# Patient Record
Sex: Male | Born: 1955 | Race: White | Hispanic: No | Marital: Married | Smoking: Never smoker
Health system: Southern US, Community
[De-identification: ages and names within clinical notes are randomized; demographics above are authoritative.]

## PROBLEM LIST (undated history)

## (undated) DIAGNOSIS — E785 Hyperlipidemia, unspecified: Secondary | ICD-10-CM

## (undated) DIAGNOSIS — L570 Actinic keratosis: Secondary | ICD-10-CM

## (undated) DIAGNOSIS — N200 Calculus of kidney: Secondary | ICD-10-CM

## (undated) DIAGNOSIS — I4892 Unspecified atrial flutter: Secondary | ICD-10-CM

## (undated) DIAGNOSIS — I10 Essential (primary) hypertension: Secondary | ICD-10-CM

## (undated) HISTORY — DX: Actinic keratosis: L57.0

## (undated) HISTORY — DX: Calculus of kidney: N20.0

## (undated) HISTORY — PX: WISDOM TOOTH EXTRACTION: SHX21

---

## 2015-05-21 ENCOUNTER — Encounter: Payer: Self-pay | Admitting: *Deleted

## 2015-05-21 ENCOUNTER — Emergency Department: Payer: BC Managed Care – PPO

## 2015-05-21 ENCOUNTER — Emergency Department
Admission: EM | Admit: 2015-05-21 | Discharge: 2015-05-21 | Disposition: A | Discharge status: Home / Self Care | Payer: BC Managed Care – PPO | Attending: Student | Admitting: Student

## 2015-05-21 DIAGNOSIS — N201 Calculus of ureter: Secondary | ICD-10-CM | POA: Diagnosis not present

## 2015-05-21 DIAGNOSIS — I10 Essential (primary) hypertension: Secondary | ICD-10-CM | POA: Insufficient documentation

## 2015-05-21 DIAGNOSIS — R1031 Right lower quadrant pain: Secondary | ICD-10-CM | POA: Diagnosis not present

## 2015-05-21 DIAGNOSIS — R109 Unspecified abdominal pain: Secondary | ICD-10-CM

## 2015-05-21 DIAGNOSIS — R52 Pain, unspecified: Secondary | ICD-10-CM

## 2015-05-21 HISTORY — DX: Essential (primary) hypertension: I10

## 2015-05-21 LAB — URINALYSIS COMPLETE WITH MICROSCOPIC (ARMC ONLY)
BACTERIA UA: NONE SEEN
BILIRUBIN URINE: NEGATIVE
Glucose, UA: NEGATIVE mg/dL
Ketones, ur: NEGATIVE mg/dL
Leukocytes, UA: NEGATIVE
Nitrite: NEGATIVE
PH: 5 (ref 5.0–8.0)
PROTEIN: NEGATIVE mg/dL
Specific Gravity, Urine: 1.017 (ref 1.005–1.030)

## 2015-05-21 LAB — COMPREHENSIVE METABOLIC PANEL
ALT: 24 U/L (ref 17–63)
AST: 30 U/L (ref 15–41)
Albumin: 4.5 g/dL (ref 3.5–5.0)
Alkaline Phosphatase: 85 U/L (ref 38–126)
Anion gap: 10 (ref 5–15)
BUN: 15 mg/dL (ref 6–20)
CHLORIDE: 107 mmol/L (ref 101–111)
CO2: 25 mmol/L (ref 22–32)
CREATININE: 1.13 mg/dL (ref 0.61–1.24)
Calcium: 9.5 mg/dL (ref 8.9–10.3)
GFR calc non Af Amer: >60 mL/min (ref >60)
Glucose, Bld: 112 mg/dL — ABNORMAL HIGH (ref 65–99)
POTASSIUM: 3.7 mmol/L (ref 3.5–5.1)
SODIUM: 142 mmol/L (ref 135–145)
Total Bilirubin: 0.8 mg/dL (ref 0.3–1.2)
Total Protein: 7.7 g/dL (ref 6.5–8.1)

## 2015-05-21 LAB — CBC
HEMATOCRIT: 43.6 % (ref 40.0–52.0)
Hemoglobin: 15.3 g/dL (ref 13.0–18.0)
MCH: 29.4 pg (ref 26.0–34.0)
MCHC: 35.1 g/dL (ref 32.0–36.0)
MCV: 83.8 fL (ref 80.0–100.0)
PLATELETS: 194 10*3/uL (ref 150–440)
RBC: 5.21 MIL/uL (ref 4.40–5.90)
RDW: 13.3 % (ref 11.5–14.5)
WBC: 8.3 10*3/uL (ref 3.8–10.6)

## 2015-05-21 LAB — LIPASE, BLOOD: LIPASE: 17 U/L (ref 11–51)

## 2015-05-21 MED ORDER — ONDANSETRON 4 MG PO TBDP: 4.0000 mg | ORAL_TABLET | Freq: Three times a day (TID) | ORAL | Status: DC | PRN

## 2015-05-21 MED ORDER — OXYCODONE HCL 5 MG PO TABS: 5.0000 mg | ORAL_TABLET | Freq: Four times a day (QID) | ORAL | Status: DC | PRN

## 2015-05-21 MED ORDER — SODIUM CHLORIDE 0.9 % IV BOLUS (SEPSIS)
1000.0000 mL | Freq: Once | INTRAVENOUS | Status: AC
  Administered 2015-05-21: 1000 mL via INTRAVENOUS

## 2015-05-21 MED ORDER — TAMSULOSIN HCL 0.4 MG PO CAPS: 0.4000 mg | ORAL_CAPSULE | Freq: Every day | ORAL | Status: DC

## 2015-05-21 MED ORDER — ONDANSETRON HCL 4 MG/2ML IJ SOLN
4.0000 mg | Freq: Once | INTRAMUSCULAR | Status: AC
  Administered 2015-05-21: 4 mg via INTRAVENOUS

## 2015-05-21 MED ORDER — ONDANSETRON HCL 4 MG/2ML IJ SOLN
INTRAMUSCULAR | Status: AC
  Filled 2015-05-21: qty 2

## 2015-05-21 MED ORDER — KETOROLAC TROMETHAMINE 30 MG/ML IJ SOLN
15.0000 mg | Freq: Once | INTRAMUSCULAR | Status: AC
  Administered 2015-05-21: 15 mg via INTRAVENOUS

## 2015-05-21 MED ORDER — ONDANSETRON HCL 4 MG/2ML IJ SOLN
4.0000 mg | Freq: Once | INTRAMUSCULAR | Status: AC
  Administered 2015-05-21: 4 mg via INTRAVENOUS
  Filled 2015-05-21: qty 2

## 2015-05-21 MED ORDER — MORPHINE SULFATE (PF) 4 MG/ML IV SOLN
4.0000 mg | Freq: Once | INTRAVENOUS | Status: AC
  Administered 2015-05-21: 4 mg via INTRAVENOUS
  Filled 2015-05-21: qty 1

## 2015-05-21 MED ORDER — KETOROLAC TROMETHAMINE 30 MG/ML IJ SOLN
INTRAMUSCULAR | Status: AC
  Administered 2015-05-21: 15 mg via INTRAVENOUS
  Filled 2015-05-21: qty 1

## 2015-05-21 NOTE — ED Notes (Signed)
Urine strainer given. Pt verbalizes understanding of use

## 2015-05-21 NOTE — ED Notes (Signed)
Dr Edd Fabian at bedside. Pt to be dc home

## 2015-05-21 NOTE — ED Notes (Signed)
Pt arrived to ED reporting sudden onset of LRQ abd pain and lower right sided back pain this morning. Pt reports feeling nauseous at this time but denies other symptoms. Pt denies trauma or injury. Pt alert and oriented at this time.

## 2015-05-21 NOTE — ED Provider Notes (Signed)
[facility] Emergency Department Provider Note  ____________________________________________  Time seen: Approximately 2:25 PM  I have reviewed the triage vital signs and the nursing notes.   HISTORY  Chief Complaint Abdominal Pain    HPI Juan Dunlap is a 60 y.o. male with history of hypertension presents for evaluation of flank and right lower abdominal pain since this morning, gradual onset, constant since onset, severe, no modifying factors. Pain associated with nausea, no vomiting, diarrhea, fevers or chills. No chest pain difficulty breathing. No recent illness including no cough, sneezing, runny nose. No dysuria or hematuria. No history of kidney stones but does have strong family history.   Past Medical History  Diagnosis Date  . Hypertension     borderline    There are no active problems to display for this patient.   History reviewed. No pertinent past surgical history.  Current Outpatient Rx  Name  Route  Sig  Dispense  Refill  . ondansetron (ZOFRAN ODT) 4 MG disintegrating tablet   Oral   Take 1 tablet (4 mg total) by mouth every 8 (eight) hours as needed for nausea or vomiting.   12 tablet   0   . oxyCODONE (ROXICODONE) 5 MG immediate release tablet   Oral   Take 1 tablet (5 mg total) by mouth every 6 (six) hours as needed for moderate pain. Do not drive while taking this medication.   12 tablet   0   . tamsulosin (FLOMAX) 0.4 MG CAPS capsule   Oral   Take 1 capsule (0.4 mg total) by mouth daily.   14 capsule   0     Allergies Review of patient's allergies indicates no known allergies.  History reviewed. No pertinent family history.  Social History Social History  Substance Use Topics  . Smoking status: Never Smoker   . Smokeless tobacco: None  . Alcohol Use: No    Review of Systems Constitutional: No fever/chills Eyes: No visual changes. ENT: No sore throat. Cardiovascular: Denies chest pain. Respiratory:  Denies shortness of breath. Gastrointestinal: + abdominal pain.  No nausea, no vomiting.  No diarrhea.  No constipation. Genitourinary: Negative for dysuria. Musculoskeletal: Positive for flank pain. Skin: Negative for rash. Neurological: Negative for headaches, focal weakness or numbness.  10-point ROS otherwise negative.  ____________________________________________   PHYSICAL EXAM:  VITAL SIGNS: ED Triage Vitals  Enc Vitals Group     BP 05/21/15 1413 174/88 mmHg     Pulse Rate 05/21/15 1413 57     Resp 05/21/15 1413 16     Temp 05/21/15 1413 98 F (36.7 C)     Temp Source 05/21/15 1413 Oral     SpO2 05/21/15 1413 97 %     Weight 05/21/15 1413 240 lb (108.863 kg)     Height 05/21/15 1413 6\' 2"  (1.88 m)     Head Cir --      Peak Flow --      Pain Score 05/21/15 1406 6     Pain Loc --      Pain Edu? --      Excl. in [place]? --     Constitutional: Alert and oriented. In distress secondary to pain. Eyes: Conjunctivae are normal. PERRL. EOMI. Head: Atraumatic. Nose: No congestion/rhinnorhea. Mouth/Throat: Mucous membranes are moist.  Oropharynx non-erythematous. Neck: No stridor.  Supple without meningismus. Cardiovascular: Normal rate, regular rhythm. Grossly normal heart sounds.  Good peripheral circulation. Respiratory: Normal respiratory effort.  No retractions. Lungs CTAB. Gastrointestinal: Soft and nontender.  No distention.  No CVA tenderness. Genitourinary: deferred Musculoskeletal: No lower extremity tenderness nor edema.  No joint effusions. Neurologic:  Normal speech and language. No gross focal neurologic deficits are appreciated. No gait instability. Skin:  Skin is warm, dry and intact. No rash noted. Psychiatric: Mood and affect are normal. Speech and behavior are normal.  ____________________________________________   LABS (all labs ordered are listed, but only abnormal results are displayed)  Labs Reviewed  COMPREHENSIVE METABOLIC PANEL - Abnormal;  Notable for the following:    Glucose, Bld 112 (*)    All other components within normal limits  URINALYSIS COMPLETEWITH MICROSCOPIC ([facility] ONLY) - Abnormal; Notable for the following:    Color, Urine YELLOW (*)    APPearance CLEAR (*)    Hgb urine dipstick 1+ (*)    Squamous Epithelial / LPF 0-5 (*)    All other components within normal limits  LIPASE, BLOOD  CBC   ____________________________________________  EKG  none ____________________________________________  RADIOLOGY  CT abdomen and pelvis IMPRESSION: 1. 4 mm stone at the right UVJ causing moderate right-sided hydronephrosis. Additional 1 mm stone within the distal right ureter, located approximately 2 cm proximal to the right UVJ. 2. Bilateral nephrolithiasis. ____________________________________________   PROCEDURES  Procedure(s) performed: None  Critical Care performed: No  ____________________________________________   INITIAL IMPRESSION / ASSESSMENT AND PLAN / ED COURSE  Pertinent labs & imaging results that were available during my care of the patient were reviewed by me and considered in my medical decision making (see chart for details).  Juan Dunlap is a 60 y.o. male with history of hypertension presents for evaluation of flank and right lower abdominal pain since this morning. On exam, he is in distress due to pain. His vital signs are stable, he is afebrile. He does not appear to have any right lower quadrant tenderness. We'll obtain screening labs, urinalysis, treat his pain and obtain CT of the abdomen and pelvis to evaluate for nephrolithiasis and rule out appendicitis.  ----------------------------------------- 5:39 PM on 05/21/2015 ----------------------------------------- Patient was given improvement of his pain at this time. He is sitting up in bed, smiling with daughter at bedside. CT scan shows 4 mm right UVJ stone which is the likely cause of his pain. Urinalysis not consistent with  infection. Unremarkable CBC, CMP lipase. We discussed expectant management, return precautions, need for close urology follow-up and he is comfortable with the discharge plan. DC home. ____________________________________________   FINAL CLINICAL IMPRESSION(S) / ED DIAGNOSES  Final diagnoses:  Flank pain, acute  Right lower quadrant abdominal pain  Pain  Ureterolithiasis      Joanne Gavel, MD 05/21/15 [phone]

## 2015-06-02 ENCOUNTER — Ambulatory Visit (INDEPENDENT_AMBULATORY_CARE_PROVIDER_SITE_OTHER): Payer: BC Managed Care – PPO | Admitting: Urology

## 2015-06-02 ENCOUNTER — Telehealth: Payer: Self-pay | Admitting: Urology

## 2015-06-02 ENCOUNTER — Encounter: Payer: Self-pay | Admitting: Urology

## 2015-06-02 VITALS — BP 150/72 | HR 55 | Ht 74.0 in | Wt 251.1 lb

## 2015-06-02 DIAGNOSIS — N201 Calculus of ureter: Secondary | ICD-10-CM

## 2015-06-02 DIAGNOSIS — Z8042 Family history of malignant neoplasm of prostate: Secondary | ICD-10-CM | POA: Diagnosis not present

## 2015-06-02 DIAGNOSIS — R3129 Other microscopic hematuria: Secondary | ICD-10-CM

## 2015-06-02 DIAGNOSIS — N200 Calculus of kidney: Secondary | ICD-10-CM

## 2015-06-02 DIAGNOSIS — N132 Hydronephrosis with renal and ureteral calculous obstruction: Secondary | ICD-10-CM | POA: Diagnosis not present

## 2015-06-02 LAB — URINALYSIS, COMPLETE
Bilirubin, UA: NEGATIVE
GLUCOSE, UA: NEGATIVE
KETONES UA: NEGATIVE
LEUKOCYTES UA: NEGATIVE
Nitrite, UA: NEGATIVE
PROTEIN UA: NEGATIVE
Urobilinogen, Ur: 0.2 mg/dL (ref 0.2–1.0)
pH, UA: 5.5 (ref 5.0–7.5)

## 2015-06-02 LAB — MICROSCOPIC EXAMINATION
Bacteria, UA: NONE SEEN
RBC, UA: NONE SEEN /hpf (ref 0–?)
WBC, UA: NONE SEEN /hpf (ref 0–?)

## 2015-06-02 MED ORDER — TAMSULOSIN HCL 0.4 MG PO CAPS: 0.4000 mg | ORAL_CAPSULE | Freq: Every day | ORAL | Status: DC

## 2015-06-02 NOTE — Telephone Encounter (Signed)
Would you send a copy of my note to Dr. Barbarann Ehlers office?

## 2015-06-02 NOTE — Progress Notes (Signed)
06/02/2015 11:12 PM   Juan Dunlap Mar 04, 1955 [id number]  Referring provider: Maryland Pink, MD [address] [facility] [place], [place] [address]  Chief Complaint  Patient presents with  . Nephrolithiasis    referred by ER    HPI: Patient is a 60 year old Caucasian male who is referred to [place] by [facility] emergency room for nephrolithiasis.    Patient states that on 05/21/2015 he's had the sudden onset of right-sided waist pain that radiated to the right flank.  The pain became so intense, he started to become nauseous and vomit. He sought treatment in the emergency room.  In the emergency room his, UA demonstrated 6-30 rbc's/hpf.  Cr of 1.13.  CT renal stone study noted a 4 mm stone at the right UVJ causing moderate right-sided hydronephrosis. Additional 1 mm stone within the distal right ureter, located approximately 2 cm proximal to the right UVJ. Bilateral nephrolithiasis.  (I personally reviewed the films the patient)   He was discharged with analgesics, tamsulosin and a strainer.    He experienced intense right-sided flank pain for 3 days after his emergency room visit and then the pain suddenly abated.  Today, he is experiencing a slight dysuria. He has not had gross hematuria, fever, chills, nausea or vomiting.  His UA is unremarkable.  He does not have a prior history of nephrolithiasis. His father and brother have had kidney stones in the past.  His father has been diagnosed with prostate cancer.  PMH: Past Medical History  Diagnosis Date  . Hypertension     borderline  . Kidney stones     Surgical History: Past Surgical History  Procedure Laterality Date  . None      Home Medications:    Medication List       This list is accurate as of: 06/02/15 11:12 PM.  Always use your most recent med list.               ondansetron 4 MG disintegrating tablet  Commonly known as:  ZOFRAN ODT  Take 1 tablet (4 mg total) by mouth  every 8 (eight) hours as needed for nausea or vomiting.     oxyCODONE 5 MG immediate release tablet  Commonly known as:  ROXICODONE  Take 1 tablet (5 mg total) by mouth every 6 (six) hours as needed for moderate pain. Do not drive while taking this medication.     tamsulosin 0.4 MG Caps capsule  Commonly known as:  FLOMAX  Take 1 capsule (0.4 mg total) by mouth daily.        Allergies: No Known Allergies  Family History: Family History  Problem Relation Age of Onset  . Kidney disease Neg Hx   . Prostate cancer Father   . Hematuria Brother   . Hematuria Father   . Kidney Stones Brother   . Kidney Stones Father     Social History:  reports that he has never smoked. He does not have any smokeless tobacco history on file. He reports that he does not drink alcohol or use illicit drugs.  ROS: UROLOGY Frequent Urination?: No Hard to postpone urination?: No Burning/pain with urination?: Yes Get up at night to urinate?: No Leakage of urine?: No Urine stream starts and stops?: No Trouble starting stream?: No Do you have to strain to urinate?: No Blood in urine?: No Urinary tract infection?: No Sexually transmitted disease?: No Injury to kidneys or bladder?: No Painful intercourse?: No Weak stream?: No  Erection problems?: No Penile pain?: No  Gastrointestinal Nausea?: Yes Vomiting?: Yes Indigestion/heartburn?: No Diarrhea?: No Constipation?: No  Constitutional Fever: No Night sweats?: No Weight loss?: No Fatigue?: No  Skin Skin rash/lesions?: No Itching?: No  Eyes Blurred vision?: No Double vision?: No  Ears/Nose/Throat Sore throat?: No Sinus problems?: No  Hematologic/Lymphatic Swollen glands?: No Easy bruising?: No  Cardiovascular Leg swelling?: No Chest pain?: No  Respiratory Cough?: No Shortness of breath?: No  Endocrine Excessive thirst?: No  Musculoskeletal Back pain?: Yes Joint pain?: Yes  Neurological Headaches?: No Dizziness?:  No  Psychologic Depression?: No Anxiety?: No  Physical Exam: BP 150/72 mmHg  Pulse 55  Ht 6' 2" (1.88 m)  Wt 251 lb 1.6 oz (113.898 kg)  BMI 32.23 kg/m2  Constitutional: Well nourished. Alert and oriented, No acute distress. HEENT: [place] AT, moist mucus membranes. Trachea midline, no masses. Cardiovascular: No clubbing, cyanosis, or edema. Respiratory: Normal respiratory effort, no increased work of breathing. GI: Abdomen is soft, non tender, non distended, no abdominal masses. Liver and spleen not palpable.  No hernias appreciated.  Stool sample for occult testing is not indicated.   GU: Mild right CVA tenderness.  No bladder fullness or masses.   Skin: No rashes, bruises or suspicious lesions. Lymph: No cervical or inguinal adenopathy. Neurologic: Grossly intact, no focal deficits, moving all 4 extremities. Psychiatric: Normal mood and affect.  Laboratory Data: Lab Results  Component Value Date   WBC 8.3 05/21/2015   HGB 15.3 05/21/2015   HCT 43.6 05/21/2015   MCV 83.8 05/21/2015   PLT 194 05/21/2015    Lab Results  Component Value Date   CREATININE 1.13 05/21/2015    Lab Results  Component Value Date   AST 30 05/21/2015   Lab Results  Component Value Date   ALT 24 05/21/2015   Urinalysis Results for orders placed or performed in visit on 06/02/15  Microscopic Examination  Result Value Ref Range   WBC, UA None seen 0 -  5 /hpf   RBC, UA None seen 0 -  2 /hpf   Epithelial Cells (non renal) 0-10 0 - 10 /hpf   Bacteria, UA None seen None seen/Few  Urinalysis, Complete  Result Value Ref Range   Specific Gravity, UA <1.005 (L) 1.005 - 1.030   pH, UA 5.5 5.0 - 7.5   Color, UA Yellow Yellow   Appearance Ur Clear Clear   Leukocytes, UA Negative Negative   Protein, UA Negative Negative/Trace   Glucose, UA Negative Negative   Ketones, UA Negative Negative   RBC, UA 1+ (A) Negative   Bilirubin, UA Negative Negative   Urobilinogen, Ur 0.2 0.2 - 1.0 mg/dL    Nitrite, UA Negative Negative   Microscopic Examination See below:     Pertinent Imaging: CLINICAL DATA: Sudden onset of right lower quadrant abdominal pain and lower right-sided back pain this morning. Nausea.  EXAM: CT ABDOMEN AND PELVIS WITHOUT CONTRAST  TECHNIQUE: Multidetector CT imaging of the abdomen and pelvis was performed following the standard protocol without IV contrast.  COMPARISON: None.  FINDINGS: Lower chest: No acute findings.  Hepatobiliary: No mass visualized on this un-enhanced exam.  Pancreas: No mass or inflammatory process identified on this un-enhanced exam.  Spleen: Within normal limits in size.  Adrenals/Urinary Tract: 4 mm stone at the right UVJ causing moderate right-sided hydronephrosis and perinephric edema. Additional 1 mm stone within the distal right ureter, located approximately 2 cm proximal to the right UVJ.  Additional 3 mm right  renal stone. There is a 9 mm nonobstructing left renal stone. No left-sided hydronephrosis. Right renal cyst measures 3 cm.  Stomach/Bowel: Bowel is normal in caliber. No bowel wall thickening or evidence of bowel wall inflammation. Appendix is normal.  Vascular/Lymphatic: No pathologically enlarged lymph nodes. No evidence of abdominal aortic aneurysm.  Reproductive: No mass or other significant abnormality.  Other: None.  Musculoskeletal: No acute or suspicious bone lesions identified. Mild degenerative change within the lumbar spine.  IMPRESSION: 1. 4 mm stone at the right UVJ causing moderate right-sided hydronephrosis. Additional 1 mm stone within the distal right ureter, located approximately 2 cm proximal to the right UVJ. 2. Bilateral nephrolithiasis.   Electronically Signed  By: Franki Cabot M.D.  On: 05/21/2015 17:03             Assessment & Plan:    1. Right UVJ stone:   We discussed MET, ESWL and URS/LL/ureteral stent placement as possible option  for definitive treatment for the 4 mm stone at the right UVJ.  He would like to continue with medical expulsion therapy at this time.   His tamsulosin is refilled.   He will continue to increase his fluid intake and strain his urine. He will bring any fragments he captures to the office for analysis.  I instructed him to contact our office or seek treatment in the ED if he becomes febrile or pain and difficult control in order to arrange for emergent/urgent intervention.  He will follow-up in one week with KUB and urinalysis.  - Urinalysis, Complete - CULTURE, URINE COMPREHENSIVE  2. Right distal stone:   1 mm stone within the distal right ureter.  See above.  3. Bilateral nephrolithiasis:   These will need to be addressed after the obstructing stones on the right have passed.  The left renal stone is very large.    4. Hydronephrosis:   Patient was found to have right hydronephrosis due to an 4 mm UVJ stone associated with a 1 mm distal right ureteral stone.   A RUS will be obtained one month after the stones have passed to ensure the hydronephrosis has resolved.    5. Microscopic hematuria:  We will continue to monitor the patient's UA after the treatment/passage of the stone to ensure the hematuria has resolved.  No gross hematuria.  No RBC's/hpf on today's UA.  If hematuria persists, we will pursue a hematuria workup with CT Urogram and cystoscopy if appropriate.  6. Family history of prostate cancer:   The patient has not had a PSA or prostate exam in 2 years.  He is at the appropriate age to begin screening.  We will address this issue further once the obstructing right ureteral stones have passed and his hydronephrosis has resolved.   Return in about 1 week (around 06/09/2015) for KUB and UA.  These notes generated with voice recognition software. I apologize for typographical errors.  Zara Council, [facility] [address] [place], [place]  [id number] [phone]

## 2015-06-03 NOTE — Telephone Encounter (Signed)
done

## 2015-06-04 LAB — CULTURE, URINE COMPREHENSIVE

## 2015-06-09 ENCOUNTER — Encounter: Payer: Self-pay | Admitting: Urology

## 2015-06-09 ENCOUNTER — Ambulatory Visit
Admission: RE | Admit: 2015-06-09 | Discharge: 2015-06-09 | Disposition: A | Discharge status: Home / Self Care | Payer: BC Managed Care – PPO | Source: Ambulatory Visit | Attending: Urology | Admitting: Urology

## 2015-06-09 ENCOUNTER — Ambulatory Visit (INDEPENDENT_AMBULATORY_CARE_PROVIDER_SITE_OTHER): Payer: BC Managed Care – PPO | Admitting: Urology

## 2015-06-09 VITALS — BP 154/76 | HR 76 | Ht 73.0 in | Wt 248.6 lb

## 2015-06-09 DIAGNOSIS — Z8042 Family history of malignant neoplasm of prostate: Secondary | ICD-10-CM | POA: Diagnosis not present

## 2015-06-09 DIAGNOSIS — N200 Calculus of kidney: Secondary | ICD-10-CM

## 2015-06-09 DIAGNOSIS — N201 Calculus of ureter: Secondary | ICD-10-CM

## 2015-06-09 DIAGNOSIS — N281 Cyst of kidney, acquired: Secondary | ICD-10-CM | POA: Diagnosis not present

## 2015-06-09 DIAGNOSIS — Z09 Encounter for follow-up examination after completed treatment for conditions other than malignant neoplasm: Secondary | ICD-10-CM | POA: Insufficient documentation

## 2015-06-09 DIAGNOSIS — Z87442 Personal history of urinary calculi: Secondary | ICD-10-CM | POA: Diagnosis not present

## 2015-06-09 DIAGNOSIS — N132 Hydronephrosis with renal and ureteral calculous obstruction: Secondary | ICD-10-CM

## 2015-06-09 DIAGNOSIS — R3129 Other microscopic hematuria: Secondary | ICD-10-CM

## 2015-06-09 LAB — URINALYSIS, COMPLETE
Bilirubin, UA: NEGATIVE
GLUCOSE, UA: NEGATIVE
KETONES UA: NEGATIVE
Leukocytes, UA: NEGATIVE
NITRITE UA: NEGATIVE
Protein, UA: NEGATIVE
RBC, UA: NEGATIVE
Specific Gravity, UA: 1.01 (ref 1.005–1.030)
UUROB: 0.2 mg/dL (ref 0.2–1.0)
pH, UA: 6.5 (ref 5.0–7.5)

## 2015-06-09 LAB — MICROSCOPIC EXAMINATION: Bacteria, UA: NONE SEEN

## 2015-06-09 NOTE — Progress Notes (Signed)
12:31 PM   Juan Dunlap 31-May-1955 [id number]  Referring provider: No referring provider defined for this encounter.  Chief Complaint  Patient presents with  . Right UVJ stone    1 week follow up     HPI: Patient is a 60 year old Caucasian male who presents today for a 1 week follow-up for KUB and UA for a right UVJ stone and a right 1 mm distal stone.    Background history Patient was referred to [place] by [facility] emergency room for nephrolithiasis.  Patient states that on 05/21/2015 he's had the sudden onset of right-sided waist pain that radiated to the right flank.  The pain became so intense, he started to become nauseous and vomit. He sought treatment in the emergency room.  In the emergency room his, UA demonstrated 6-30 rbc's/hpf.  Cr of 1.13.  CT renal stone study noted a 4 mm stone at the right UVJ causing moderate right-sided hydronephrosis. Additional 1 mm stone within the distal right ureter, located approximately 2 cm proximal to the right UVJ. Bilateral nephrolithiasis.  (I personally reviewed the films the patient)   He was discharged with analgesics, tamsulosin and a strainer.  He experienced intense right-sided flank pain for 3 days after his emergency room visit and then the pain suddenly abated.  He does not have a prior history of nephrolithiasis. His father and brother have had kidney stones in the past.  Today, the patient is experiencing some frequent urination and slight dysuria. He states that he is not sure if he passed a specific stone fragment.  His UA today is unremarkable.  KUB taken on 06/09/2015 could not rule out the passage of the distal right ureteral stones.  Personally reviewed the films with the patient. Patient is traveling to [place] and 2 weeks and we need a confirmation that the stone has passed.  Renal ultrasound was then performed today and it noted solution of the right hydronephrosis since the prior CT.  Personally  reviewed the ultrasound.  We then proceeded with a CT renal stone study for further confirmation. It noted in her pole resolution of the right hydronephrosis and the distal right ureteral calculus were no longer visible.  Personally reviewed the images.  He does have a remaining 10 mm nonobstructing calculus in the mid pole of the left kidney and a 3 mm nonobstructing calculus in the lower pole of the right kidney. No left hydronephrosis was noted on CT scan.  His father has been diagnosed with prostate cancer.    PMH: Past Medical History  Diagnosis Date  . Hypertension     borderline  . Kidney stones     Surgical History: Past Surgical History  Procedure Laterality Date  . None      Home Medications:    Medication List       This list is accurate as of: 06/09/15 11:59 PM.  Always use your most recent med list.               ondansetron 4 MG disintegrating tablet  Commonly known as:  ZOFRAN ODT  Take 1 tablet (4 mg total) by mouth every 8 (eight) hours as needed for nausea or vomiting.     oxyCODONE 5 MG immediate release tablet  Commonly known as:  ROXICODONE  Take 1 tablet (5 mg total) by mouth every 6 (six) hours as needed for moderate pain. Do not drive while taking this medication.     tamsulosin  0.4 MG Caps capsule  Commonly known as:  FLOMAX  Take 1 capsule (0.4 mg total) by mouth daily.        Allergies: No Known Allergies  Family History: Family History  Problem Relation Age of Onset  . Kidney disease Neg Hx   . Prostate cancer Father   . Hematuria Brother   . Hematuria Father   . Kidney Stones Brother   . Kidney Stones Father     Social History:  reports that he has never smoked. He does not have any smokeless tobacco history on file. He reports that he does not drink alcohol or use illicit drugs.  ROS: UROLOGY Frequent Urination?: Yes Hard to postpone urination?: No Burning/pain with urination?: Yes Get up at night to urinate?: No Leakage  of urine?: No Urine stream starts and stops?: No Trouble starting stream?: No Do you have to strain to urinate?: No Blood in urine?: No Urinary tract infection?: No Sexually transmitted disease?: No Injury to kidneys or bladder?: No Painful intercourse?: No Weak stream?: No Erection problems?: No Penile pain?: No  Gastrointestinal Nausea?: No Vomiting?: No Indigestion/heartburn?: No Diarrhea?: No Constipation?: No  Constitutional Fever: No Night sweats?: No Weight loss?: No Fatigue?: No  Skin Skin rash/lesions?: No Itching?: No  Eyes Blurred vision?: No Double vision?: No  Ears/Nose/Throat Sore throat?: No Sinus problems?: No  Hematologic/Lymphatic Swollen glands?: No Easy bruising?: No  Cardiovascular Leg swelling?: No Chest pain?: No  Respiratory Cough?: No Shortness of breath?: No  Endocrine Excessive thirst?: No  Musculoskeletal Back pain?: Yes Joint pain?: No  Neurological Headaches?: No Dizziness?: No  Psychologic Depression?: No Anxiety?: No  Physical Exam: BP 154/76 mmHg  Pulse 76  Ht 6\' 1"  (1.854 m)  Wt 248 lb 9.6 oz (112.764 kg)  BMI 32.81 kg/m2  Constitutional: Well nourished. Alert and oriented, No acute distress. HEENT: [place] AT, moist mucus membranes. Trachea midline, no masses. Cardiovascular: No clubbing, cyanosis, or edema. Respiratory: Normal respiratory effort, no increased work of breathing. GI: Abdomen is soft, non tender, non distended, no abdominal masses. Liver and spleen not palpable.  No hernias appreciated.  Stool sample for occult testing is not indicated.   GU: Mild right CVA tenderness.  No bladder fullness or masses.   Skin: No rashes, bruises or suspicious lesions. Lymph: No cervical or inguinal adenopathy. Neurologic: Grossly intact, no focal deficits, moving all 4 extremities. Psychiatric: Normal mood and affect.  Laboratory Data: Lab Results  Component Value Date   WBC 8.3 05/21/2015   HGB 15.3  05/21/2015   HCT 43.6 05/21/2015   MCV 83.8 05/21/2015   PLT 194 05/21/2015    Lab Results  Component Value Date   CREATININE 1.13 05/21/2015    Lab Results  Component Value Date   AST 30 05/21/2015   Lab Results  Component Value Date   ALT 24 05/21/2015   Urinalysis Results for orders placed or performed in visit on 06/09/15  Microscopic Examination  Result Value Ref Range   WBC, UA 0-5 0 -  5 /hpf   RBC, UA 0-2 0 -  2 /hpf   Epithelial Cells (non renal) 0-10 0 - 10 /hpf   Bacteria, UA None seen None seen/Few  Urinalysis, Complete  Result Value Ref Range   Specific Gravity, UA 1.010 1.005 - 1.030   pH, UA 6.5 5.0 - 7.5   Color, UA Yellow Yellow   Appearance Ur Clear Clear   Leukocytes, UA Negative Negative   Protein, UA Negative  Negative/Trace   Glucose, UA Negative Negative   Ketones, UA Negative Negative   RBC, UA Negative Negative   Bilirubin, UA Negative Negative   Urobilinogen, Ur 0.2 0.2 - 1.0 mg/dL   Nitrite, UA Negative Negative   Microscopic Examination See below:     Pertinent Imaging: CLINICAL DATA: Sudden onset of right lower quadrant abdominal pain and lower right-sided back pain this morning. Nausea.  EXAM: CT ABDOMEN AND PELVIS WITHOUT CONTRAST  TECHNIQUE: Multidetector CT imaging of the abdomen and pelvis was performed following the standard protocol without IV contrast.  COMPARISON: None.  FINDINGS: Lower chest: No acute findings.  Hepatobiliary: No mass visualized on this un-enhanced exam.  Pancreas: No mass or inflammatory process identified on this un-enhanced exam.  Spleen: Within normal limits in size.  Adrenals/Urinary Tract: 4 mm stone at the right UVJ causing moderate right-sided hydronephrosis and perinephric edema. Additional 1 mm stone within the distal right ureter, located approximately 2 cm proximal to the right UVJ.  Additional 3 mm right renal stone. There is a 9 mm nonobstructing left renal stone.  No left-sided hydronephrosis. Right renal cyst measures 3 cm.  Stomach/Bowel: Bowel is normal in caliber. No bowel wall thickening or evidence of bowel wall inflammation. Appendix is normal.  Vascular/Lymphatic: No pathologically enlarged lymph nodes. No evidence of abdominal aortic aneurysm.  Reproductive: No mass or other significant abnormality.  Other: None.  Musculoskeletal: No acute or suspicious bone lesions identified. Mild degenerative change within the lumbar spine.  IMPRESSION: 1. 4 mm stone at the right UVJ causing moderate right-sided hydronephrosis. Additional 1 mm stone within the distal right ureter, located approximately 2 cm proximal to the right UVJ. 2. Bilateral nephrolithiasis.   Electronically Signed  By: Franki Cabot M.D.  On: 05/21/2015 17:03           CLINICAL DATA: Kidney stones.  EXAM: ABDOMEN - 1 VIEW  COMPARISON: CT 05/21/2015.  FINDINGS: Soft tissue structures are unremarkable. Left nephrolithiasis is present. No change from prior CT. Pelvic calcifications consistent phleboliths. A approximately 4 mm calcific density is noted in the midportion of the lower most aspect of the pelvis most likely in the lower portion of the bladder. This may represent recently passed stone. No bowel distention. No acute bony abnormality.  IMPRESSION: 1. Previously identified distal right ureteral stone cannot be excluded. A calcification is noted over the lower most portion of the pelvis. This may represent recently passed stone with stone being in the lower portion of the bladder.  2. Stable left nephrolithiasis.   Electronically Signed  By: Dalzell  On: 06/09/2015 10:58  CLINICAL DATA: Intermittent right flank pain. Right ureteral calculus and hydronephrosis seen on recent CT.  EXAM: RENAL / URINARY TRACT ULTRASOUND COMPLETE  COMPARISON: CT on 05/21/2015  FINDINGS: Right Kidney:  Length: 11.0  cm. Echogenicity within normal limits. Simple appearing subcapsular cyst is again seen in midpole which measures 3.5 cm. No complex cystic or solid renal masses are identified. There is no evidence of hydronephrosis. This has resolved compared with previous CT.  Left Kidney:  Length: 12.2 cm. Echogenicity within normal limits. A 1.5 cm calculus with acoustic shadowing is seen in the midpole of left kidney. No mass or hydronephrosis visualized.  Bladder:  Appears normal for degree of bladder distention. Bilateral ureteral jets seen on color Doppler ultrasound.  IMPRESSION: Resolution of right hydronephrosis since prior CT. Stable benign-appearing right renal cyst.  1.5 cm nonobstructive shadowing calculus in midpole of left kidney. No evidence of  left-sided hydronephrosis.   Electronically Signed  By: Earle Gell M.D.  On: 06/09/2015 11:21 CLINICAL DATA: Right flank and back pain for 3 weeks. Nephrolithiasis.  EXAM: CT ABDOMEN AND PELVIS WITHOUT CONTRAST  TECHNIQUE: Multidetector CT imaging of the abdomen and pelvis was performed following the standard protocol without IV contrast.  COMPARISON: 05/21/2015  FINDINGS: Lower chest: No acute findings.  Hepatobiliary: No mass visualized on this un-enhanced exam. Gallbladder is unremarkable.  Pancreas: No mass or inflammatory process identified on this un-enhanced exam.  Spleen: Within normal limits in size.  Adrenals/Urinary Tract: Previously seen calculus at the right ureterovesical junction is no longer visualized and there has been resolution of right-sided hydroureteronephrosis since previous study.  A 3 mm nonobstructive calculus is noted in the lower pole of the right kidney and a 10 mm nonobstructive calculus is seen in the midpole of the left kidney. No evidence of left-sided hydronephrosis or ureteral calculi. Subcapsular fluid attenuation cyst in the midpole of the right kidney  remains stable.  Stomach/Bowel: Unremarkable.  Vascular/Lymphatic: No pathologically enlarged lymph nodes. No evidence of abdominal aortic aneurysm.  Reproductive: No mass or other significant abnormality.  Other: None.  Musculoskeletal: No suspicious bone lesions identified.  IMPRESSION: Interval resolution of right hydroureteronephrosis and distal right ureteral calculus since prior study.  Bilateral nonobstructive nephrolithiasis. No acute findings.   Electronically Signed  By: Earle Gell M.D.  On: 06/09/2015 12:33             Assessment & Plan:    1. Right UVJ stone:   CT renal stone study and renal ultrasound performed today on 06/09/2015 has noted the resolution of the right hydronephrosis and the right ureteral stones are no longer visualized.    2. Right distal stone:   1 mm stone within the distal right ureter.  See above.  3. Bilateral nephrolithiasis:    The left renal stone is very large.  He will contact us when he returns from [place] to discuss definitive intervention for the remaining stones.  4. Hydronephrosis:   Right hydronephrosis has resolved.    5. Microscopic hematuria:  UA is negative for hematuria. Patient does not course gross hematuria. We will continue to monitor.   6. Family history of prostate cancer:   The patient has not had a PSA or prostate exam in 2 years.  He is at the appropriate age to begin screening.  He will be returning for an office visit after his vacation and [place]. We'll obtain a PSA and perform a prostate exam at that time.   Return for Patient to schedule appointment when he returns from [place].  These notes generated with voice recognition software. I apologize for typographical errors.  Zara Council, [facility] [address] [place], [place] [address] [phone]

## 2015-06-11 DIAGNOSIS — N201 Calculus of ureter: Secondary | ICD-10-CM | POA: Insufficient documentation

## 2015-06-11 DIAGNOSIS — N200 Calculus of kidney: Secondary | ICD-10-CM | POA: Insufficient documentation

## 2015-08-16 ENCOUNTER — Ambulatory Visit (INDEPENDENT_AMBULATORY_CARE_PROVIDER_SITE_OTHER): Payer: BC Managed Care – PPO | Admitting: Urology

## 2015-08-16 ENCOUNTER — Encounter: Payer: Self-pay | Admitting: Urology

## 2015-08-16 VITALS — BP 168/77 | HR 55 | Ht 74.0 in | Wt 254.7 lb

## 2015-08-16 DIAGNOSIS — N132 Hydronephrosis with renal and ureteral calculous obstruction: Secondary | ICD-10-CM

## 2015-08-16 DIAGNOSIS — Z8042 Family history of malignant neoplasm of prostate: Secondary | ICD-10-CM

## 2015-08-16 DIAGNOSIS — N200 Calculus of kidney: Secondary | ICD-10-CM | POA: Diagnosis not present

## 2015-08-16 DIAGNOSIS — N201 Calculus of ureter: Secondary | ICD-10-CM | POA: Diagnosis not present

## 2015-08-16 DIAGNOSIS — R3129 Other microscopic hematuria: Secondary | ICD-10-CM | POA: Diagnosis not present

## 2015-08-16 NOTE — Progress Notes (Signed)
5:32 PM   Juan Dunlap 12/09/1955 [id number]  Referring provider: Maryland Pink, MD [address] [facility], [place] [address]  Chief Complaint  Patient presents with  . Nephrolithiasis    Left follow up    HPI: Patient is a 60 year old Caucasian male who presents today for a prostate exam, PSA and to discuss definitive treatment for his renal stones.    BPH  His IPSS score today is 0/0, which is no lower urinary tract symptomatology. He is delighted with his quality life due to his urinary symptoms.  He denies any dysuria, hematuria or suprapubic pain.  He also denies any recent fevers, chills, nausea or vomiting.  His father has been diagnosed with prostate cancer.      IPSS    Row Name 08/16/15 1500         International Prostate Symptom Score   How often have you had the sensation of not emptying your bladder? Not at All     How often have you had to urinate less than every two hours? Not at All     How often have you found you stopped and started again several times when you urinated? Not at All     How often have you found it difficult to postpone urination? Not at All     How often have you had a weak urinary stream? Not at All     How often have you had to strain to start urination? Not at All     How many times did you typically get up at night to urinate? None     Total IPSS Score 0       Quality of Life due to urinary symptoms   If you were to spend the rest of your life with your urinary condition just the way it is now how would you feel about that? Delighted        Score:  1-7 Mild 8-19 Moderate 20-35 Severe  Nephrolithiasis Patient spontaneously passed right ureteral stones two months ago.  Repeat CT scan noted interval resolution of the right hydronephrosis and distal right ureteral calculi.  The small 3 mm stone in the right kidney and the left 10 mm stone in the left kidney remain.  Patient has not had any further flank pain or  gross hematuria.  He is concerned about the remaining stones and future compromise of renal function.  He would like these addressed.  He has not had any fevers, chills, nausea or vomiting.  UA is positive for AMH.    Family history of prostate cancer The patient has not had a PSA or prostate exam in 2 years.  He is at the appropriate age to begin screening.  PSA is drawn today.    PMH: Past Medical History:  Diagnosis Date  . Hypertension    borderline  . Kidney stones     Surgical History: Past Surgical History:  Procedure Laterality Date  . none      Home Medications:    Medication List       Accurate as of 08/16/15 11:59 PM. Always use your most recent med list.          ondansetron 4 MG disintegrating tablet Commonly known as:  ZOFRAN ODT Take 1 tablet (4 mg total) by mouth every 8 (eight) hours as needed for nausea or vomiting.   oxyCODONE 5 MG immediate release tablet Commonly known as:  ROXICODONE Take 1 tablet (  5 mg total) by mouth every 6 (six) hours as needed for moderate pain. Do not drive while taking this medication.   tamsulosin 0.4 MG Caps capsule Commonly known as:  FLOMAX Take 1 capsule (0.4 mg total) by mouth daily.       Allergies: No Known Allergies  Family History: Family History  Problem Relation Age of Onset  . Prostate cancer Father   . Hematuria Father   . Kidney Stones Father   . Hematuria Brother   . Kidney Stones Brother   . Kidney disease Neg Hx     Social History:  reports that he has never smoked. He does not have any smokeless tobacco history on file. He reports that he does not drink alcohol or use drugs.  ROS: UROLOGY Frequent Urination?: No Hard to postpone urination?: No Burning/pain with urination?: No Get up at night to urinate?: No Leakage of urine?: No Urine stream starts and stops?: No Trouble starting stream?: No Do you have to strain to urinate?: No Blood in urine?: No Urinary tract infection?:  No Sexually transmitted disease?: No Injury to kidneys or bladder?: No Painful intercourse?: No Weak stream?: No Erection problems?: No Penile pain?: No  Gastrointestinal Nausea?: No Vomiting?: No Indigestion/heartburn?: No Diarrhea?: No Constipation?: No  Constitutional Fever: No Night sweats?: No Weight loss?: No Fatigue?: No  Skin Skin rash/lesions?: No Itching?: No  Eyes Blurred vision?: No Double vision?: No  Ears/Nose/Throat Sore throat?: No Sinus problems?: No  Hematologic/Lymphatic Swollen glands?: No Easy bruising?: No  Cardiovascular Leg swelling?: No Chest pain?: No  Respiratory Cough?: No Shortness of breath?: No  Endocrine Excessive thirst?: No  Musculoskeletal Back pain?: No Joint pain?: No  Neurological Headaches?: No Dizziness?: No  Psychologic Depression?: No Anxiety?: No  Physical Exam: BP (!) 168/77   Pulse (!) 55   Ht 6\' 2"  (1.88 m)   Wt 254 lb 11.2 oz (115.5 kg)   BMI 32.70 kg/m   Constitutional: Well nourished. Alert and oriented, No acute distress. HEENT: [place] AT, moist mucus membranes. Trachea midline, no masses. Cardiovascular: No clubbing, cyanosis, or edema. Respiratory: Normal respiratory effort, no increased work of breathing. GI: Abdomen is soft, non tender, non distended, no abdominal masses. Liver and spleen not palpable.  No hernias appreciated.  Stool sample for occult testing is not indicated.   GU: No CVA tenderness.  No bladder fullness or masses.  Patient with circumcised phallus.  Urethral meatus is patent.  No penile discharge. No penile lesions or rashes. Scrotum without lesions, cysts, rashes and/or edema.  Testicles are located scrotally bilaterally. No masses are appreciated in the testicles. Left and right epididymis are normal. Rectal: Patient with  normal sphincter tone. Anus and perineum without scarring or rashes. No rectal masses are appreciated. Prostate is approximately 50 grams, no nodules  are appreciated. Seminal vesicles are normal. Skin: No rashes, bruises or suspicious lesions. Lymph: No cervical or inguinal adenopathy. Neurologic: Grossly intact, no focal deficits, moving all 4 extremities. Psychiatric: Normal mood and affect.  Laboratory Data: Lab Results  Component Value Date   WBC 8.3 05/21/2015   HGB 15.3 05/21/2015   HCT 43.6 05/21/2015   MCV 83.8 05/21/2015   PLT 194 05/21/2015    Lab Results  Component Value Date   CREATININE 1.13 05/21/2015    Lab Results  Component Value Date   AST 30 05/21/2015   Lab Results  Component Value Date   ALT 24 05/21/2015   Urinalysis Results for orders placed or  performed in visit on 08/16/15  Microscopic Examination  Result Value Ref Range   WBC, UA None seen 0 - 5 /hpf   RBC, UA 11-30 (A) 0 - 2 /hpf   Epithelial Cells (non renal) None seen 0 - 10 /hpf   Bacteria, UA None seen None seen/Few  PSA  Result Value Ref Range   Prostate Specific Ag, Serum 1.4 0.0 - 4.0 ng/mL  Urinalysis, Complete  Result Value Ref Range   Specific Gravity, UA 1.025 1.005 - 1.030   pH, UA 5.0 5.0 - 7.5   Color, UA Yellow Yellow   Appearance Ur Clear Clear   Leukocytes, UA Negative Negative   Protein, UA Negative Negative/Trace   Glucose, UA Negative Negative   Ketones, UA Negative Negative   RBC, UA 1+ (A) Negative   Bilirubin, UA Negative Negative   Urobilinogen, Ur 0.2 0.2 - 1.0 mg/dL   Nitrite, UA Negative Negative   Microscopic Examination See below:    Pertinent Imaging CLINICAL DATA:  Right flank and back pain for 3 weeks. Nephrolithiasis.  EXAM: CT ABDOMEN AND PELVIS WITHOUT CONTRAST  TECHNIQUE: Multidetector CT imaging of the abdomen and pelvis was performed following the standard protocol without IV contrast.  COMPARISON:  05/21/2015  FINDINGS: Lower chest:  No acute findings.  Hepatobiliary: No mass visualized on this un-enhanced exam. Gallbladder is unremarkable.  Pancreas: No mass or  inflammatory process identified on this un-enhanced exam.  Spleen: Within normal limits in size.  Adrenals/Urinary Tract: Previously seen calculus at the right ureterovesical junction is no longer visualized and there has been resolution of right-sided hydroureteronephrosis since previous study.  A 3 mm nonobstructive calculus is noted in the lower pole of the right kidney and a 10 mm nonobstructive calculus is seen in the midpole of the left kidney. No evidence of left-sided hydronephrosis or ureteral calculi. Subcapsular fluid attenuation cyst in the midpole of the right kidney remains stable.  Stomach/Bowel: Unremarkable.  Vascular/Lymphatic: No pathologically enlarged lymph nodes. No evidence of abdominal aortic aneurysm.  Reproductive: No mass or other significant abnormality.  Other: None.  Musculoskeletal:  No suspicious bone lesions identified.  IMPRESSION: Interval resolution of right hydroureteronephrosis and distal right ureteral calculus since prior study.  Bilateral nonobstructive nephrolithiasis.  No acute findings.   Electronically Signed   By: Earle Gell M.D.   On: 06/09/2015 12:33     Assessment & Plan:    Patient will undergo left ureteroscopy with laser lithotripsy with ureteral stent placement for his 10 mm left midpole renal stone.      1. Left renal stone:    The left renal stone is very large.  We discussed ESWL and URS are possible treatment options for his stone.  I discussed how each procedure is performed and the risks involved.  He would like to have left URS/LL/ureteral stent placement for definitive treatment for his stone.   I explained to the patient how the procedure is performed and the risks involved.    I informed patient that he will have a stent placed during the procedure and will remain in place after the procedure for a short time.  It will be removed in the office with a cystoscope, unless a string in left in  place.  I informed that patient that about 50% of patients who undergo ureteroscopy and have a stent will have "stent pain," and this is by far the most common risk/complaint following ureteroscopy. A stent is a soft plastic tube (about half  the size of IV tubing) that allows the kidney to drain to the bladder regardless of edema or obstruction. Not only can the stent "rub" on the inside of the bladder, causing a feeling of needing to urinate/overactive bladder, but also the stent allows urine to pass up from the bladder to the kidney during urination - causing symptoms from a warm, tingling sensation to intense pain in the affected flank.   They may be residual stones within the kidney or ureter may be present up to 40% of the time following ureteroscopy, depending on the original stone size and location. These stone fragments will be seen and addressed on follow-up imaging.  Injury to the ureter is the most common intra-operative complication during ureteroscopy. The reported risk of perforation ranges greatly, depending on whether it is defined as a complete perforation (0.1-0.7% - think of this as a hole through the entire ureter), a partial perforation (1.6% - a hole nearly through the entire ureter), or mucosal tear/scrape (5% - these are similar to a sore on the inside of the mouth). Almost 100% of these will heal with prolonged stenting (anywhere between 2 - 4 weeks). Should a large perforation occur, your urologist may chose to stop the procedure and return on another day when the ureter has had time to heal.  2. Right UVJ stone:   CT renal stone study and renal ultrasound performed on 06/09/2015 has noted the resolution of the right hydronephrosis and the right ureteral stones are no longer visualized.    3. Right distal stone:   Resolved.  4. Hydronephrosis:   Right hydronephrosis has resolved.    5. Microscopic hematuria:  We will continue to monitor the patient's UA after the  treatment/passage of the stone to ensure the hematuria has resolved.  If hematuria persists, we will pursue a hematuria workup with CT Urogram and cystoscopy if appropriate.  6. Family history of prostate cancer:   The patient has not had a PSA or prostate exam in 2 years.  He is at the appropriate age to begin screening.  PSA drawn today.     Return for left URS/LL/ureteral stent placement.  These notes generated with voice recognition software. I apologize for typographical errors.  Zara Council, [facility] [address], [place], [place] [address] [phone]

## 2015-08-17 ENCOUNTER — Telehealth: Payer: Self-pay

## 2015-08-17 LAB — MICROSCOPIC EXAMINATION
Bacteria, UA: NONE SEEN
Epithelial Cells (non renal): NONE SEEN /hpf (ref 0–10)
WBC, UA: NONE SEEN /hpf (ref 0–?)

## 2015-08-17 LAB — URINALYSIS, COMPLETE
Bilirubin, UA: NEGATIVE
GLUCOSE, UA: NEGATIVE
Ketones, UA: NEGATIVE
Leukocytes, UA: NEGATIVE
Nitrite, UA: NEGATIVE
PROTEIN UA: NEGATIVE
Specific Gravity, UA: 1.025 (ref 1.005–1.030)
Urobilinogen, Ur: 0.2 mg/dL (ref 0.2–1.0)
pH, UA: 5 (ref 5.0–7.5)

## 2015-08-17 LAB — PSA: PROSTATE SPECIFIC AG, SERUM: 1.4 ng/mL (ref 0.0–4.0)

## 2015-08-17 NOTE — Telephone Encounter (Signed)
-----   Message from Nori Riis, PA-C sent at 08/17/2015  8:13 AM EDT ----- Patient's PSA is normal.  I suggest yearly screenings.

## 2015-08-17 NOTE — Telephone Encounter (Signed)
LMOM

## 2015-08-18 ENCOUNTER — Telehealth: Payer: Self-pay | Admitting: Radiology

## 2015-08-18 NOTE — Telephone Encounter (Signed)
Notified pt's wife, Lelon Frohlich, of surgery scheduled 09/07/15 with Dr Erlene Quan, pre-admit phone interview on 08/26/15 between 9am-1pm & to call day prior to surgery for arrival time to [facility]. Ann voices understanding.

## 2015-08-18 NOTE — Telephone Encounter (Signed)
LMOM

## 2015-08-18 NOTE — Telephone Encounter (Signed)
LMOM. Need to notify pt of surgery information. 

## 2015-08-23 NOTE — Telephone Encounter (Signed)
Notified pt that PSA is normal & Larene Beach suggests yearly screenings. Pt voices understanding.

## 2015-08-23 NOTE — Telephone Encounter (Signed)
-----   Message from Nori Riis, PA-C sent at 08/17/2015  8:13 AM EDT ----- Patient's PSA is normal.  I suggest yearly screenings.

## 2015-08-23 NOTE — Telephone Encounter (Signed)
LMOM that labs were normal, follow up in one year if any questions please call office, number given.

## 2015-08-26 ENCOUNTER — Encounter
Admission: RE | Admit: 2015-08-26 | Discharge: 2015-08-26 | Disposition: A | Discharge status: Home / Self Care | Payer: BC Managed Care – PPO | Source: Ambulatory Visit | Attending: Urology | Admitting: Urology

## 2015-08-26 NOTE — Patient Instructions (Signed)
  Your procedure is scheduled on: 09-07-15 Report to Same Day Surgery 2nd floor medical mall To find out your arrival time please call [phone] between 1PM - 3PM on 09-06-15  Remember: Instructions that are not followed completely may result in serious medical risk, up to and including death, or upon the discretion of your surgeon and anesthesiologist your surgery may need to be rescheduled.    _x___ 1. Do not eat food or drink liquids after midnight. No gum chewing or hard candies.     __x__ 2. No Alcohol for 24 hours before or after surgery.   __x__3. No Smoking for 24 prior to surgery.   ____  4. Bring all medications with you on the day of surgery if instructed.    __x__ 5. Notify your doctor if there is any change in your medical condition     (cold, fever, infections).     Do not wear jewelry, make-up, hairpins, clips or nail polish.  Do not wear lotions, powders, or perfumes. You may wear deodorant.  Do not shave 48 hours prior to surgery. Men may shave face and neck.  Do not bring valuables to the hospital.    [facility] is not responsible for any belongings or valuables.               Contacts, dentures or bridgework may not be worn into surgery.  Leave your suitcase in the car. After surgery it may be brought to your room.  For patients admitted to the hospital, discharge time is determined by your treatment team.   Patients discharged the day of surgery will not be allowed to drive home.    Please read over the following fact sheets that you were given:   [facility] Preparing for Surgery and or MRSA Information   ____ Take these medicines the morning of surgery with A SIP OF WATER:    1. none  2.  3.  4.  5.  6.  ____ Fleet Enema (as directed)   ____ Use CHG Soap or sage wipes as directed on instruction sheet   ____ Use inhalers on the day of surgery and bring to hospital day of surgery  ____ Stop metformin 2 days prior to surgery    ____ Take 1/2 of  usual insulin dose the night before surgery and none on the morning of  surgery.   ____ Stop aspirin or coumadin, or plavix  _x__ Stop Anti-inflammatories such as Advil, Aleve, Ibuprofen, Motrin, Naproxen,          Naprosyn, Goodies powders or aspirin products 7 DAYS PRIOR TO SURGERY- Ok to take Tylenol.   ____ Stop supplements until after surgery.    ____ Bring C-Pap to the hospital.

## 2015-09-06 ENCOUNTER — Encounter: Payer: Self-pay | Admitting: *Deleted

## 2015-09-07 ENCOUNTER — Encounter
Admission: RE | Disposition: A | Discharge status: Home / Self Care | Payer: Self-pay | Source: Ambulatory Visit | Attending: Urology

## 2015-09-07 ENCOUNTER — Ambulatory Visit: Payer: BC Managed Care – PPO | Admitting: Anesthesiology

## 2015-09-07 ENCOUNTER — Ambulatory Visit
Admission: RE | Admit: 2015-09-07 | Discharge: 2015-09-07 | Disposition: A | Discharge status: Home / Self Care | Payer: BC Managed Care – PPO | Source: Ambulatory Visit | Attending: Urology | Admitting: Urology

## 2015-09-07 ENCOUNTER — Encounter: Payer: Self-pay | Admitting: *Deleted

## 2015-09-07 DIAGNOSIS — Z87442 Personal history of urinary calculi: Secondary | ICD-10-CM | POA: Insufficient documentation

## 2015-09-07 DIAGNOSIS — Z841 Family history of disorders of kidney and ureter: Secondary | ICD-10-CM | POA: Diagnosis not present

## 2015-09-07 DIAGNOSIS — N400 Enlarged prostate without lower urinary tract symptoms: Secondary | ICD-10-CM | POA: Insufficient documentation

## 2015-09-07 DIAGNOSIS — R3129 Other microscopic hematuria: Secondary | ICD-10-CM | POA: Insufficient documentation

## 2015-09-07 DIAGNOSIS — N200 Calculus of kidney: Secondary | ICD-10-CM | POA: Diagnosis not present

## 2015-09-07 DIAGNOSIS — I10 Essential (primary) hypertension: Secondary | ICD-10-CM | POA: Insufficient documentation

## 2015-09-07 DIAGNOSIS — Z6832 Body mass index (BMI) 32.0-32.9, adult: Secondary | ICD-10-CM | POA: Diagnosis not present

## 2015-09-07 DIAGNOSIS — Z8042 Family history of malignant neoplasm of prostate: Secondary | ICD-10-CM | POA: Insufficient documentation

## 2015-09-07 HISTORY — PX: CYSTOSCOPY WITH STENT PLACEMENT: SHX5790

## 2015-09-07 HISTORY — PX: CYSTOSCOPY W/ RETROGRADES: SHX1426

## 2015-09-07 HISTORY — PX: URETEROSCOPY WITH HOLMIUM LASER LITHOTRIPSY: SHX6645

## 2015-09-07 SURGERY — URETEROSCOPY, WITH LITHOTRIPSY USING HOLMIUM LASER
Anesthesia: General | Laterality: Left

## 2015-09-07 MED ORDER — PROPOFOL 10 MG/ML IV BOLUS
INTRAVENOUS | Status: DC | PRN
  Administered 2015-09-07: 150 mg via INTRAVENOUS
  Administered 2015-09-07: 200 mg via INTRAVENOUS

## 2015-09-07 MED ORDER — CEFAZOLIN SODIUM-DEXTROSE 2-4 GM/100ML-% IV SOLN: 2.0000 g | Freq: Once | INTRAVENOUS | Status: DC

## 2015-09-07 MED ORDER — DOCUSATE SODIUM 100 MG PO CAPS: 100.0000 mg | ORAL_CAPSULE | Freq: Two times a day (BID) | ORAL | 0 refills | Status: DC

## 2015-09-07 MED ORDER — ROCURONIUM BROMIDE 100 MG/10ML IV SOLN
INTRAVENOUS | Status: DC | PRN
  Administered 2015-09-07: 10 mg via INTRAVENOUS
  Administered 2015-09-07: 50 mg via INTRAVENOUS

## 2015-09-07 MED ORDER — FAMOTIDINE 20 MG PO TABS
ORAL_TABLET | ORAL | Status: AC
  Filled 2015-09-07: qty 1

## 2015-09-07 MED ORDER — FAMOTIDINE 20 MG PO TABS
20.0000 mg | ORAL_TABLET | Freq: Once | ORAL | Status: AC
  Administered 2015-09-07: 20 mg via ORAL

## 2015-09-07 MED ORDER — HYDROCODONE-ACETAMINOPHEN 5-325 MG PO TABS: 1.0000 | ORAL_TABLET | Freq: Four times a day (QID) | ORAL | 0 refills | Status: DC | PRN

## 2015-09-07 MED ORDER — ACETAMINOPHEN 10 MG/ML IV SOLN
INTRAVENOUS | Status: DC | PRN
  Administered 2015-09-07: 1000 mg via INTRAVENOUS

## 2015-09-07 MED ORDER — MIDAZOLAM HCL 2 MG/2ML IJ SOLN
INTRAMUSCULAR | Status: DC | PRN
  Administered 2015-09-07: 2 mg via INTRAVENOUS

## 2015-09-07 MED ORDER — TAMSULOSIN HCL 0.4 MG PO CAPS: 0.4000 mg | ORAL_CAPSULE | Freq: Every day | ORAL | 0 refills | Status: DC

## 2015-09-07 MED ORDER — FENTANYL CITRATE (PF) 100 MCG/2ML IJ SOLN
INTRAMUSCULAR | Status: DC | PRN
  Administered 2015-09-07: 250 ug via INTRAVENOUS
  Administered 2015-09-07 (×2): 50 ug via INTRAVENOUS

## 2015-09-07 MED ORDER — DEXAMETHASONE SODIUM PHOSPHATE 4 MG/ML IJ SOLN
INTRAMUSCULAR | Status: DC | PRN
  Administered 2015-09-07: 10 mg via INTRAVENOUS

## 2015-09-07 MED ORDER — ONDANSETRON HCL 4 MG/2ML IJ SOLN
INTRAMUSCULAR | Status: DC | PRN
  Administered 2015-09-07: 4 mg via INTRAVENOUS

## 2015-09-07 MED ORDER — LACTATED RINGERS IV SOLN
INTRAVENOUS | Status: DC
  Administered 2015-09-07: 09:00:00 via INTRAVENOUS

## 2015-09-07 MED ORDER — OXYBUTYNIN CHLORIDE 5 MG PO TABS: 5.0000 mg | ORAL_TABLET | Freq: Three times a day (TID) | ORAL | 0 refills | Status: DC | PRN

## 2015-09-07 MED ORDER — SUGAMMADEX SODIUM 200 MG/2ML IV SOLN
INTRAVENOUS | Status: DC | PRN
  Administered 2015-09-07: 230 mg via INTRAVENOUS

## 2015-09-07 MED ORDER — LIDOCAINE HCL (CARDIAC) 20 MG/ML IV SOLN
INTRAVENOUS | Status: DC | PRN
  Administered 2015-09-07: 100 mg via INTRAVENOUS

## 2015-09-07 MED ORDER — GLYCOPYRROLATE 0.2 MG/ML IJ SOLN
INTRAMUSCULAR | Status: DC | PRN
  Administered 2015-09-07: 0.2 mg via INTRAVENOUS

## 2015-09-07 MED ORDER — ACETAMINOPHEN 10 MG/ML IV SOLN
INTRAVENOUS | Status: AC
  Filled 2015-09-07: qty 100

## 2015-09-07 MED ORDER — ONDANSETRON HCL 4 MG/2ML IJ SOLN: 4.0000 mg | Freq: Once | INTRAMUSCULAR | Status: DC | PRN

## 2015-09-07 MED ORDER — IOTHALAMATE MEGLUMINE 43 % IV SOLN
INTRAVENOUS | Status: DC | PRN
  Administered 2015-09-07: 30 mL

## 2015-09-07 MED ORDER — CEFAZOLIN SODIUM-DEXTROSE 2-4 GM/100ML-% IV SOLN
INTRAVENOUS | Status: AC
  Filled 2015-09-07: qty 100

## 2015-09-07 MED ORDER — FENTANYL CITRATE (PF) 100 MCG/2ML IJ SOLN: 25.0000 ug | INTRAMUSCULAR | Status: DC | PRN

## 2015-09-07 SURGICAL SUPPLY — 31 items
ADAPTER SCOPE UROLOK II (MISCELLANEOUS) IMPLANT
BAG DRAIN CYSTO-URO LG1000N (MISCELLANEOUS) ×4 IMPLANT
BASKET ZERO TIP 1.9FR (BASKET) IMPLANT
CATH FOL 2WAY LX 16X5 (CATHETERS) IMPLANT
CATH URETL 5X70 OPEN END (CATHETERS) ×4 IMPLANT
CNTNR SPEC 2.5X3XGRAD LEK (MISCELLANEOUS) ×2
CONRAY 43 FOR UROLOGY 50M (MISCELLANEOUS) ×4 IMPLANT
CONT SPEC 4OZ STER OR WHT (MISCELLANEOUS) ×2
CONTAINER SPEC 2.5X3XGRAD LEK (MISCELLANEOUS) ×2 IMPLANT
DRAPE UTILITY 15X26 TOWEL STRL (DRAPES) ×4 IMPLANT
FIBER LASER LITHO 273 (Laser) ×4 IMPLANT
GLOVE BIO SURGEON STRL SZ 6.5 (GLOVE) ×3 IMPLANT
GLOVE BIO SURGEONS STRL SZ 6.5 (GLOVE) ×1
GOWN STRL REUS W/ TWL LRG LVL4 (GOWN DISPOSABLE) ×4 IMPLANT
GOWN STRL REUS W/TWL LRG LVL4 (GOWN DISPOSABLE) ×4
HOLDER FOLEY CATH W/STRAP (MISCELLANEOUS) IMPLANT
INTRODUCER DILATOR DOUBLE (INTRODUCER) ×4 IMPLANT
KIT RM TURNOVER CYSTO AR (KITS) ×4 IMPLANT
PACK CYSTO AR (MISCELLANEOUS) ×4 IMPLANT
PUMP SINGLE ACTION SAP (PUMP) IMPLANT
SENSORWIRE 0.038 NOT ANGLED (WIRE) ×4
SET CYSTO W/LG BORE CLAMP LF (SET/KITS/TRAYS/PACK) ×4 IMPLANT
SHEATH URETERAL 12FRX35CM (MISCELLANEOUS) ×4 IMPLANT
SOL .9 NS 3000ML IRR  AL (IV SOLUTION) ×2
SOL .9 NS 3000ML IRR UROMATIC (IV SOLUTION) ×2 IMPLANT
STENT URET 6FRX24 CONTOUR (STENTS) IMPLANT
STENT URET 6FRX26 CONTOUR (STENTS) ×4 IMPLANT
SURGILUBE 2OZ TUBE FLIPTOP (MISCELLANEOUS) ×4 IMPLANT
SYRINGE IRR TOOMEY STRL 70CC (SYRINGE) ×4 IMPLANT
WATER STERILE IRR 1000ML POUR (IV SOLUTION) ×4 IMPLANT
WIRE SENSOR 0.038 NOT ANGLED (WIRE) ×2 IMPLANT

## 2015-09-07 NOTE — Transfer of Care (Signed)
Immediate Anesthesia Transfer of Care Note  Patient: Juan Dunlap  Procedure(s) Performed: Procedure(s): URETEROSCOPY WITH HOLMIUM LASER LITHOTRIPSY (Left) CYSTOSCOPY WITH STENT PLACEMENT (Left) CYSTOSCOPY WITH RETROGRADE PYELOGRAM (Bilateral)  Patient Location: PACU  Anesthesia Type:General  Level of Consciousness: awake and alert   Airway & Oxygen Therapy: Patient Spontanous Breathing and Patient connected to face mask oxygen  Post-op Assessment: Report given to RN and Post -op Vital signs reviewed and stable  Post vital signs: Reviewed  Last Vitals:  Vitals:   09/07/15 1145 09/07/15 1146  BP: (!) 181/96 (!) 180/101  Pulse: 84 75  Resp: 14 11  Temp: 36.3 C 36.3 C    Last Pain:  Vitals:   09/07/15 1145  TempSrc:   PainSc: Asleep         Complications: No apparent anesthesia complications

## 2015-09-07 NOTE — H&P (View-Only) (Signed)
5:32 PM   Juan Dunlap 10/31/1955 [id number]  Referring provider: Maryland Pink, MD [address] [facility] [place], [place] [address]  Chief Complaint  Patient presents with  . Nephrolithiasis    Left follow up    HPI: Patient is a 60 year old Caucasian male who presents today for a prostate exam, PSA and to discuss definitive treatment for his renal stones.    BPH  His IPSS score today is 0/0, which is no lower urinary tract symptomatology. He is delighted with his quality life due to his urinary symptoms.  He denies any dysuria, hematuria or suprapubic pain.  He also denies any recent fevers, chills, nausea or vomiting.  His father has been diagnosed with prostate cancer.      IPSS    Row Name 08/16/15 1500         International Prostate Symptom Score   How often have you had the sensation of not emptying your bladder? Not at All     How often have you had to urinate less than every two hours? Not at All     How often have you found you stopped and started again several times when you urinated? Not at All     How often have you found it difficult to postpone urination? Not at All     How often have you had a weak urinary stream? Not at All     How often have you had to strain to start urination? Not at All     How many times did you typically get up at night to urinate? None     Total IPSS Score 0       Quality of Life due to urinary symptoms   If you were to spend the rest of your life with your urinary condition just the way it is now how would you feel about that? Delighted        Score:  1-7 Mild 8-19 Moderate 20-35 Severe  Nephrolithiasis Patient spontaneously passed right ureteral stones two months ago.  Repeat CT scan noted interval resolution of the right hydronephrosis and distal right ureteral calculi.  The small 3 mm stone in the right kidney and the left 10 mm stone in the left kidney remain.  Patient has not had any further flank pain or  gross hematuria.  He is concerned about the remaining stones and future compromise of renal function.  He would like these addressed.  He has not had any fevers, chills, nausea or vomiting.  UA is positive for AMH.    Family history of prostate cancer The patient has not had a PSA or prostate exam in 2 years.  He is at the appropriate age to begin screening.  PSA is drawn today.    PMH: Past Medical History:  Diagnosis Date  . Hypertension    borderline  . Kidney stones     Surgical History: Past Surgical History:  Procedure Laterality Date  . none      Home Medications:    Medication List       Accurate as of 08/16/15 11:59 PM. Always use your most recent med list.          ondansetron 4 MG disintegrating tablet Commonly known as:  ZOFRAN ODT Take 1 tablet (4 mg total) by mouth every 8 (eight) hours as needed for nausea or vomiting.   oxyCODONE 5 MG immediate release tablet Commonly known as:  ROXICODONE Take 1 tablet (  5 mg total) by mouth every 6 (six) hours as needed for moderate pain. Do not drive while taking this medication.   tamsulosin 0.4 MG Caps capsule Commonly known as:  FLOMAX Take 1 capsule (0.4 mg total) by mouth daily.       Allergies: No Known Allergies  Family History: Family History  Problem Relation Age of Onset  . Prostate cancer Father   . Hematuria Father   . Kidney Stones Father   . Hematuria Brother   . Kidney Stones Brother   . Kidney disease Neg Hx     Social History:  reports that he has never smoked. He does not have any smokeless tobacco history on file. He reports that he does not drink alcohol or use drugs.  ROS: UROLOGY Frequent Urination?: No Hard to postpone urination?: No Burning/pain with urination?: No Get up at night to urinate?: No Leakage of urine?: No Urine stream starts and stops?: No Trouble starting stream?: No Do you have to strain to urinate?: No Blood in urine?: No Urinary tract infection?:  No Sexually transmitted disease?: No Injury to kidneys or bladder?: No Painful intercourse?: No Weak stream?: No Erection problems?: No Penile pain?: No  Gastrointestinal Nausea?: No Vomiting?: No Indigestion/heartburn?: No Diarrhea?: No Constipation?: No  Constitutional Fever: No Night sweats?: No Weight loss?: No Fatigue?: No  Skin Skin rash/lesions?: No Itching?: No  Eyes Blurred vision?: No Double vision?: No  Ears/Nose/Throat Sore throat?: No Sinus problems?: No  Hematologic/Lymphatic Swollen glands?: No Easy bruising?: No  Cardiovascular Leg swelling?: No Chest pain?: No  Respiratory Cough?: No Shortness of breath?: No  Endocrine Excessive thirst?: No  Musculoskeletal Back pain?: No Joint pain?: No  Neurological Headaches?: No Dizziness?: No  Psychologic Depression?: No Anxiety?: No  Physical Exam: BP (!) 168/77   Pulse (!) 55   Ht 6\' 2"  (1.88 m)   Wt 254 lb 11.2 oz (115.5 kg)   BMI 32.70 kg/m   Constitutional: Well nourished. Alert and oriented, No acute distress. HEENT: [place] AT, moist mucus membranes. Trachea midline, no masses. Cardiovascular: No clubbing, cyanosis, or edema. Respiratory: Normal respiratory effort, no increased work of breathing. GI: Abdomen is soft, non tender, non distended, no abdominal masses. Liver and spleen not palpable.  No hernias appreciated.  Stool sample for occult testing is not indicated.   GU: No CVA tenderness.  No bladder fullness or masses.  Patient with circumcised phallus.  Urethral meatus is patent.  No penile discharge. No penile lesions or rashes. Scrotum without lesions, cysts, rashes and/or edema.  Testicles are located scrotally bilaterally. No masses are appreciated in the testicles. Left and right epididymis are normal. Rectal: Patient with  normal sphincter tone. Anus and perineum without scarring or rashes. No rectal masses are appreciated. Prostate is approximately 50 grams, no nodules  are appreciated. Seminal vesicles are normal. Skin: No rashes, bruises or suspicious lesions. Lymph: No cervical or inguinal adenopathy. Neurologic: Grossly intact, no focal deficits, moving all 4 extremities. Psychiatric: Normal mood and affect.  Laboratory Data: Lab Results  Component Value Date   WBC 8.3 05/21/2015   HGB 15.3 05/21/2015   HCT 43.6 05/21/2015   MCV 83.8 05/21/2015   PLT 194 05/21/2015    Lab Results  Component Value Date   CREATININE 1.13 05/21/2015    Lab Results  Component Value Date   AST 30 05/21/2015   Lab Results  Component Value Date   ALT 24 05/21/2015   Urinalysis Results for orders placed or  performed in visit on 08/16/15  Microscopic Examination  Result Value Ref Range   WBC, UA None seen 0 - 5 /hpf   RBC, UA 11-30 (A) 0 - 2 /hpf   Epithelial Cells (non renal) None seen 0 - 10 /hpf   Bacteria, UA None seen None seen/Few  PSA  Result Value Ref Range   Prostate Specific Ag, Serum 1.4 0.0 - 4.0 ng/mL  Urinalysis, Complete  Result Value Ref Range   Specific Gravity, UA 1.025 1.005 - 1.030   pH, UA 5.0 5.0 - 7.5   Color, UA Yellow Yellow   Appearance Ur Clear Clear   Leukocytes, UA Negative Negative   Protein, UA Negative Negative/Trace   Glucose, UA Negative Negative   Ketones, UA Negative Negative   RBC, UA 1+ (A) Negative   Bilirubin, UA Negative Negative   Urobilinogen, Ur 0.2 0.2 - 1.0 mg/dL   Nitrite, UA Negative Negative   Microscopic Examination See below:    Pertinent Imaging CLINICAL DATA:  Right flank and back pain for 3 weeks. Nephrolithiasis.  EXAM: CT ABDOMEN AND PELVIS WITHOUT CONTRAST  TECHNIQUE: Multidetector CT imaging of the abdomen and pelvis was performed following the standard protocol without IV contrast.  COMPARISON:  05/21/2015  FINDINGS: Lower chest:  No acute findings.  Hepatobiliary: No mass visualized on this un-enhanced exam. Gallbladder is unremarkable.  Pancreas: No mass or  inflammatory process identified on this un-enhanced exam.  Spleen: Within normal limits in size.  Adrenals/Urinary Tract: Previously seen calculus at the right ureterovesical junction is no longer visualized and there has been resolution of right-sided hydroureteronephrosis since previous study.  A 3 mm nonobstructive calculus is noted in the lower pole of the right kidney and a 10 mm nonobstructive calculus is seen in the midpole of the left kidney. No evidence of left-sided hydronephrosis or ureteral calculi. Subcapsular fluid attenuation cyst in the midpole of the right kidney remains stable.  Stomach/Bowel: Unremarkable.  Vascular/Lymphatic: No pathologically enlarged lymph nodes. No evidence of abdominal aortic aneurysm.  Reproductive: No mass or other significant abnormality.  Other: None.  Musculoskeletal:  No suspicious bone lesions identified.  IMPRESSION: Interval resolution of right hydroureteronephrosis and distal right ureteral calculus since prior study.  Bilateral nonobstructive nephrolithiasis.  No acute findings.   Electronically Signed   By: Earle Gell M.D.   On: 06/09/2015 12:33     Assessment & Plan:    Patient will undergo left ureteroscopy with laser lithotripsy with ureteral stent placement for his 10 mm left midpole renal stone.      1. Left renal stone:    The left renal stone is very large.  We discussed ESWL and URS are possible treatment options for his stone.  I discussed how each procedure is performed and the risks involved.  He would like to have left URS/LL/ureteral stent placement for definitive treatment for his stone.   I explained to the patient how the procedure is performed and the risks involved.    I informed patient that he will have a stent placed during the procedure and will remain in place after the procedure for a short time.  It will be removed in the office with a cystoscope, unless a string in left in  place.  I informed that patient that about 50% of patients who undergo ureteroscopy and have a stent will have "stent pain," and this is by far the most common risk/complaint following ureteroscopy. A stent is a soft plastic tube (about half  the size of IV tubing) that allows the kidney to drain to the bladder regardless of edema or obstruction. Not only can the stent "rub" on the inside of the bladder, causing a feeling of needing to urinate/overactive bladder, but also the stent allows urine to pass up from the bladder to the kidney during urination - causing symptoms from a warm, tingling sensation to intense pain in the affected flank.   They may be residual stones within the kidney or ureter may be present up to 40% of the time following ureteroscopy, depending on the original stone size and location. These stone fragments will be seen and addressed on follow-up imaging.  Injury to the ureter is the most common intra-operative complication during ureteroscopy. The reported risk of perforation ranges greatly, depending on whether it is defined as a complete perforation (0.1-0.7% - think of this as a hole through the entire ureter), a partial perforation (1.6% - a hole nearly through the entire ureter), or mucosal tear/scrape (5% - these are similar to a sore on the inside of the mouth). Almost 100% of these will heal with prolonged stenting (anywhere between 2 - 4 weeks). Should a large perforation occur, your urologist may chose to stop the procedure and return on another day when the ureter has had time to heal.  2. Right UVJ stone:   CT renal stone study and renal ultrasound performed on 06/09/2015 has noted the resolution of the right hydronephrosis and the right ureteral stones are no longer visualized.    3. Right distal stone:   Resolved.  4. Hydronephrosis:   Right hydronephrosis has resolved.    5. Microscopic hematuria:  We will continue to monitor the patient's UA after the  treatment/passage of the stone to ensure the hematuria has resolved.  If hematuria persists, we will pursue a hematuria workup with CT Urogram and cystoscopy if appropriate.  6. Family history of prostate cancer:   The patient has not had a PSA or prostate exam in 2 years.  He is at the appropriate age to begin screening.  PSA drawn today.     Return for left URS/LL/ureteral stent placement.  These notes generated with voice recognition software. I apologize for typographical errors.  Zara Council, [facility] [address] [place], [place] [address] [phone]

## 2015-09-07 NOTE — Anesthesia Procedure Notes (Signed)
Procedure Name: Intubation Date/Time: 09/07/2015 10:21 AM Performed by: Rosaria Ferries, Maniya Donovan Patient Re-evaluated:Patient Re-evaluated prior to inductionOxygen Delivery Method: Circle system utilized Preoxygenation: Pre-oxygenation with 100% oxygen Intubation Type: IV induction Ventilation: Mask ventilation without difficulty Laryngoscope Size: Miller and 3 Grade View: Grade III Tube size: 7.0 mm Number of attempts: 3 Airway Equipment and Method: Bougie stylet Placement Confirmation: positive ETCO2 and breath sounds checked- equal and bilateral Secured at: 23 cm Tube secured with: Tape Dental Injury: Teeth and Oropharynx as per pre-operative assessment  Difficulty Due To: Difficulty was unanticipated and Difficult Airway- due to anterior larynx Future Recommendations: Recommend- induction with short-acting agent, and alternative techniques readily available

## 2015-09-07 NOTE — Anesthesia Preprocedure Evaluation (Signed)
Anesthesia Evaluation  Patient identified by MRN, date of birth, ID band Patient awake    Reviewed: Allergy & Precautions, NPO status , Patient's Chart, lab work & pertinent test results  Airway Mallampati: II       Dental  (+) Teeth Intact   Pulmonary neg pulmonary ROS,    breath sounds clear to auscultation       Cardiovascular hypertension,  Rhythm:Regular     Neuro/Psych negative neurological ROS     GI/Hepatic negative GI ROS, Neg liver ROS,   Endo/Other  Morbid obesity  Renal/GU negative Renal ROS     Musculoskeletal negative musculoskeletal ROS (+)   Abdominal (+) + obese,   Peds  Hematology   Anesthesia Other Findings   Reproductive/Obstetrics                             Anesthesia Physical Anesthesia Plan  ASA: II  Anesthesia Plan: General   Post-op Pain Management:    Induction: Intravenous  Airway Management Planned: LMA and Oral ETT  Additional Equipment:   Intra-op Plan:   Post-operative Plan: Extubation in OR  Informed Consent: I have reviewed the patients History and Physical, chart, labs and discussed the procedure including the risks, benefits and alternatives for the proposed anesthesia with the patient or authorized representative who has indicated his/her understanding and acceptance.     Plan Discussed with: CRNA  Anesthesia Plan Comments:         Anesthesia Quick Evaluation

## 2015-09-07 NOTE — Op Note (Signed)
Date of procedure: 09/07/15  Preoperative diagnosis:  1. Left kidney stone 2. Microscopic hematuria   Postoperative diagnosis:  1. Same as above   Procedure: 1. Cystoscopy 2. Bilateral retrograde pyelogram 3. Left ureteroscopy 4. Laser lithotripsy 5. Basket extraction of stone fragment 6. Left ureteral stent placement  Surgeon: Hollice Espy, MD  Anesthesia: General  Complications: None  Intraoperative findings: Large 1 cm left midpole stone. Bilateral retrograde pyelogram negative.  EBL: Minimal  Specimens: Stone fragment  Drains: 6 x 26 French double-J ureteral stent on left  Indication: Juan Dunlap is a 60 y.o. patient with history of nephrolithiasis and microscopic hematuria who presents today for management of his nonobstructing left-sided 1 cm stone. In addition, is a history of microhematuria therefore we will proceed today with bilateral retrograde pyelogram for further workup of this..  After reviewing the management options for treatment, he elected to proceed with the above surgical procedure(s). We have discussed the potential benefits and risks of the procedure, side effects of the proposed treatment, the likelihood of the patient achieving the goals of the procedure, and any potential problems that might occur during the procedure or recuperation. Informed consent has been obtained.  Description of procedure:  The patient was taken to the operating room and general anesthesia was induced.  The patient was placed in the dorsal lithotomy position, prepped and draped in the usual sterile fashion, and preoperative antibiotics were administered. A preoperative time-out was performed.   A rigid 21 French cystoscope was advanced per urethra into the bladder. The bladder was carefully inspected and was noted to be free of any lesions, tumors, or stones. The trigone was normal with bilateral ureteral orifices in anatomic position. There was no trabeculation appreciated.  The bladder neck was minimally elevated. Attention was then turned to the right ureteral orifice which was cannulated using a 5 [place] open-ended ureteral catheter. A gentle retrograde pyelogram was performed injecting contrast at which time a very delicate appearing right ureter and a normal upper tract collecting system without any filling defects or lesions identified. The same exact procedure was performed on the left side which is also normal without filling defects or hydronephrosis.  A wire was then placed up to level of the kidney under fluoroscopic guidance. A dual lumen introducer was used just within the distal ureter to introduce the Super Stiff wire. The sensor wire was snapped in place as a safety wire. At this point time, a Cook ureteral access sheath, 45 cm was advanced over the wire up to level the proximal ureter without resistance. The inner lumen was removed along with the Super Stiff wire. A flexible 8 French dual-lumen Wolfe ureteroscope was advanced through the access sheath easily into the renal pelvis. At this time formal pyeloscopy was performed. This revealed some small stone debris within the most dependent calyx in the 1 cm stone was encountered within a midpole calyx. Within this calyx, there was a mildly stenotic infundibulum with significant edematous urothelium. A 275  laser fiber was then brought in and using settings of initially 0.2 J and 40 Hz and later 1.0 joule and 15 Hz, the stone was fragmented into very small pieces. The larger of the pieces were removed and a piece wise fashion using a 1.9 [place] nitinol basket. The most sizable pieces were passed off the field as stone specimen. Once the kidney was adequately cleared of all stone debris, a second retrograde pyelogram was created to create a roadmap. Each never calyx was then again  directly visualized to ensure that no residual stone fragments remained. At this time, the procedure was deemed nearly complete, the scope  was backed down the length of the ureter removing the sheath along the way. There were no ureteral fragments identified but a small area of the urethral mucosal abrasion within the proximal ureter.  Finally, the safety sensor wire was backloaded over a rigid cystoscope. A 6 x 26 French double-J ureteral stent was advanced over the wire up to level of the kidney. The wire was partially drawn until hook was noted over the upper pole. The wire was then fully withdrawn and a full coil started within the bladder. The bladder was then drained. The patient was then repositioned supine position, reversed from anesthesia, taken the PACU in stable condition.  Plan: Patient will follow-up in 1 week for cystoscopy, stent removal.  Hollice Espy, M.D.

## 2015-09-07 NOTE — Interval H&P Note (Signed)
History and Physical Interval Note:  09/07/2015 9:43 AM  Juan Dunlap  has presented today for surgery, with the diagnosis of LEFT RENAL STONE  The various methods of treatment have been discussed with the patient and family. After consideration of risks, benefits and other options for treatment, the patient has consented to  Procedure(s): URETEROSCOPY WITH HOLMIUM LASER LITHOTRIPSY (Left) HOLMIUM LASER APPLICATION (Left) CYSTOSCOPY WITH STENT PLACEMENT (Left) as a surgical intervention .  The patient's history has been reviewed, patient examined, no change in status, stable for surgery.  I have reviewed the patient's chart and labs.  Questions were answered to the patient's satisfaction.    RRR CTAB  Hollice Espy

## 2015-09-07 NOTE — Op Note (Signed)
Dr Laureen Abrahams in to see pt earlier

## 2015-09-07 NOTE — Discharge Instructions (Signed)
You have a ureteral stent in place.  This is a tube that extends from your kidney to your bladder.  This may cause urinary bleeding, burning with urination, and urinary frequency.  Please call our office or present to the ED if you develop fevers >101 or pain which is not able to be controlled with oral pain medications.  You may be given either Flomax and/ or ditropan to help with bladder spasms and stent pain in addition to pain medications.   ° °[facility] °[address], [address] °[place], [place] [address] °[phone] ° ° ° °AMBULATORY SURGERY  °DISCHARGE INSTRUCTIONS ° ° °1) The drugs that you were given will stay in your system until tomorrow so for the next 24 hours you should not: ° °A) Drive an automobile °B) Make any legal decisions °C) Drink any alcoholic beverage ° ° °2) You may resume regular meals tomorrow.  Today it is better to start with liquids and gradually work up to solid foods. ° °You may eat anything you prefer, but it is better to start with liquids, then soup and crackers, and gradually work up to solid foods. ° ° °3) Please notify your doctor immediately if you have any unusual bleeding, trouble breathing, redness and pain at the surgery site, drainage, fever, or pain not relieved by medication. ° ° ° °4) Additional Instructions: ° ° ° ° ° ° ° °Please contact your physician with any problems or Same Day Surgery at [phone], Monday through Friday 6 am to 4 pm, or [place] [facility] number at [phone]. °

## 2015-09-07 NOTE — Anesthesia Postprocedure Evaluation (Signed)
Anesthesia Post Note  Patient: Juan Dunlap  Procedure(s) Performed: Procedure(s) (LRB): URETEROSCOPY WITH HOLMIUM LASER LITHOTRIPSY (Left) CYSTOSCOPY WITH STENT PLACEMENT (Left) CYSTOSCOPY WITH RETROGRADE PYELOGRAM (Bilateral)  Patient location during evaluation: PACU Anesthesia Type: General Level of consciousness: awake Pain management: pain level controlled Vital Signs Assessment: post-procedure vital signs reviewed and stable Respiratory status: nonlabored ventilation Cardiovascular status: stable Anesthetic complications: no    Last Vitals:  Vitals:   09/07/15 1210 09/07/15 1220  BP: (!) 157/86 (!) 157/84  Pulse: 72 68  Resp:  14  Temp: 36.4 C 36.4 C    Last Pain:  Vitals:   09/07/15 1220  TempSrc: Oral  PainSc:                  VAN STAVEREN,[name]

## 2015-09-15 ENCOUNTER — Ambulatory Visit
Admission: RE | Admit: 2015-09-15 | Discharge: 2015-09-15 | Disposition: A | Discharge status: Home / Self Care | Payer: BC Managed Care – PPO | Source: Ambulatory Visit | Attending: Urology | Admitting: Urology

## 2015-09-15 ENCOUNTER — Encounter: Payer: Self-pay | Admitting: Urology

## 2015-09-15 ENCOUNTER — Ambulatory Visit (INDEPENDENT_AMBULATORY_CARE_PROVIDER_SITE_OTHER): Payer: BC Managed Care – PPO | Admitting: Urology

## 2015-09-15 VITALS — BP 163/83 | HR 64 | Ht 74.0 in | Wt 243.4 lb

## 2015-09-15 DIAGNOSIS — N200 Calculus of kidney: Secondary | ICD-10-CM

## 2015-09-15 DIAGNOSIS — R3129 Other microscopic hematuria: Secondary | ICD-10-CM

## 2015-09-15 DIAGNOSIS — R600 Localized edema: Secondary | ICD-10-CM

## 2015-09-15 LAB — URINALYSIS, COMPLETE
Bilirubin, UA: NEGATIVE
Glucose, UA: NEGATIVE
NITRITE UA: NEGATIVE
PH UA: 5.5 (ref 5.0–7.5)
SPEC GRAV UA: 1.025 (ref 1.005–1.030)
Urobilinogen, Ur: 1 mg/dL (ref 0.2–1.0)

## 2015-09-15 LAB — MICROSCOPIC EXAMINATION
BACTERIA UA: NONE SEEN
EPITHELIAL CELLS (NON RENAL): NONE SEEN /HPF (ref 0–10)
WBC, UA: NONE SEEN /hpf (ref 0–?)

## 2015-09-15 MED ORDER — CIPROFLOXACIN HCL 500 MG PO TABS
500.0000 mg | ORAL_TABLET | Freq: Once | ORAL | Status: AC
  Administered 2015-09-15: 500 mg via ORAL

## 2015-09-15 MED ORDER — LIDOCAINE HCL 2 % EX GEL
1.0000 "application " | Freq: Once | CUTANEOUS | Status: AC
  Administered 2015-09-15: 1 via URETHRAL

## 2015-09-15 NOTE — Progress Notes (Signed)
09/15/2015 9:06 AM   Juan Dunlap November 16, 1955 [id number]  Referring provider: Maryland Pink, MD [address] [facility] [place], [place] [address]  Chief Complaint  Patient presents with  . Cysto Stent Removal    ureteral stone  . Joint Swelling    swelling, pain in Leftt ankle. w/o injury x 6 days     HPI: The patient is a 60 year old gentleman who recently underwent left ureteroscopy and bilateral retrograde pyelogram for microscopic hematuria and a left 1 cm renal stone. He presents today for left ureteral stent removal.  The patient has done well since procedure. He denies any pain at this time. It is working well. Stone analysis is pending.  He does complain of swelling in his left lower extremity particularly at his ankle. This started shortly after surgery.   PMH: Past Medical History:  Diagnosis Date  . Hypertension    borderline-pt states it is higher in md office but home checks are 140's/70's-PT CONTROLS BP WITH DIET AND EXERCISE PER PT  . Kidney stones     Surgical History: Past Surgical History:  Procedure Laterality Date  . CYSTOSCOPY W/ RETROGRADES Bilateral 09/07/2015   Procedure: CYSTOSCOPY WITH RETROGRADE PYELOGRAM;  Surgeon: Hollice Espy, MD;  Location: [facility] ORS;  Service: Urology;  Laterality: Bilateral;  . CYSTOSCOPY WITH STENT PLACEMENT Left 09/07/2015   Procedure: CYSTOSCOPY WITH STENT PLACEMENT;  Surgeon: Hollice Espy, MD;  Location: [facility];  Service: Urology;  Laterality: Left;  . URETEROSCOPY WITH HOLMIUM LASER LITHOTRIPSY Left 09/07/2015   Procedure: URETEROSCOPY WITH HOLMIUM LASER LITHOTRIPSY;  Surgeon: Hollice Espy, MD;  Location: [facility];  Service: Urology;  Laterality: Left;  . WISDOM TOOTH EXTRACTION      Home Medications:    Medication List       Accurate as of 09/15/15  9:06 AM. Always use your most recent med list.          cholecalciferol 1000 units tablet Commonly known as:  VITAMIN D Take 1,000 Units by mouth  daily.   docusate sodium 100 MG capsule Commonly known as:  COLACE Take 1 capsule (100 mg total) by mouth 2 (two) times daily.   HYDROcodone-acetaminophen 5-325 MG tablet Commonly known as:  NORCO/VICODIN Take 1-2 tablets by mouth every 6 (six) hours as needed for moderate pain.   multivitamin with minerals Tabs tablet Take 1 tablet by mouth daily.   oxybutynin 5 MG tablet Commonly known as:  DITROPAN Take 1 tablet (5 mg total) by mouth every 8 (eight) hours as needed for bladder spasms.   oxyCODONE 5 MG immediate release tablet Commonly known as:  ROXICODONE Take 1 tablet (5 mg total) by mouth every 6 (six) hours as needed for moderate pain. Do not drive while taking this medication.   tamsulosin 0.4 MG Caps capsule Commonly known as:  FLOMAX Take 1 capsule (0.4 mg total) by mouth daily.       Allergies: No Known Allergies  Family History: Family History  Problem Relation Age of Onset  . Prostate cancer Father   . Hematuria Father   . Kidney Stones Father   . Hematuria Brother   . Kidney Stones Brother   . Kidney disease Neg Hx     Social History:  reports that he has never smoked. He has never used smokeless tobacco. He reports that he does not drink alcohol or use drugs.  ROS:  Physical Exam: BP (!) 163/83   Pulse 64   Ht 6\' 2"  (1.88 m)   Wt 243 lb 6.4 oz (110.4 kg)   BMI 31.25 kg/m   Constitutional:  Alert and oriented, No acute distress. HEENT: [place] AT, moist mucus membranes.  Trachea midline, no masses. Cardiovascular: No clubbing, cyanosis, or edema. Respiratory: Normal respiratory effort, no increased work of breathing. GI: Abdomen is soft, nontender, nondistended, no abdominal masses GU: No CVA tenderness.  Skin: No rashes, bruises or suspicious lesions. Lymph: No cervical or inguinal adenopathy. Neurologic: Grossly intact, no focal deficits, moving all 4 extremities. Psychiatric: Normal  mood and affect. Ext: Patient's left ankle is edematous compared to the right. Slightly tender to palpation. No popliteal tenderness. Calf and upper thigh does not seem to be swol  Laboratory Data: Lab Results  Component Value Date   WBC 8.3 05/21/2015   HGB 15.3 05/21/2015   HCT 43.6 05/21/2015   MCV 83.8 05/21/2015   PLT 194 05/21/2015    Lab Results  Component Value Date   CREATININE 1.13 05/21/2015    No results found for: PSA  No results found for: TESTOSTERONE  No results found for: HGBA1C  Urinalysis    Component Value Date/Time   COLORURINE YELLOW (A) 05/21/2015 1528   APPEARANCEUR Clear 08/16/2015 1509   LABSPEC 1.017 05/21/2015 1528   PHURINE 5.0 05/21/2015 1528   GLUCOSEU Negative 08/16/2015 1509   HGBUR 1+ (A) 05/21/2015 1528   BILIRUBINUR Negative 08/16/2015 1509   KETONESUR NEGATIVE 05/21/2015 1528   PROTEINUR Negative 08/16/2015 1509   PROTEINUR NEGATIVE 05/21/2015 1528   NITRITE Negative 08/16/2015 1509   NITRITE NEGATIVE 05/21/2015 1528   LEUKOCYTESUR Negative 08/16/2015 1509    Cystoscopy Procedure Note  Patient identification was confirmed, informed consent was obtained, and patient was prepped using Betadine solution.  Lidocaine jelly was administered per urethral meatus.    Preoperative abx where received prior to procedure.     Pre-Procedure: - Inspection reveals a normal caliber ureteral meatus.  Procedure: The flexible cystoscope was introduced without difficulty - No urethral strictures/lesions are present. - Enlarged prostate  - Normal bladder neck  -Left ureteral stent was grasped with flexible graspers and removed intact through the urethral meatus   Post-Procedure: - Patient tolerated the procedure well     Assessment & Plan:    1. Left renal stone Left ureteral stent removed today. The patient will follow-up in one month to rule out iatrogenic hydronephrosis and to discuss his pending stone analysis.  2. Microscopic  hematuria -negative workup except for above  3. Left lower extremity edema -postop  -We will get a stat duplex of his left lower extremity to rule out postoperative DVT   Return for with Dr. Erlene Quan with renal u/s prior.  Nickie Retort, MD  [facility] [address], [place], [place] [address] [phone]

## 2015-09-19 LAB — STONE ANALYSIS
CA OXALATE, DIHYDRATE: 3 %
Ca Oxalate,Monohydr.: 90 %
Ca phos cry stone ql IR: 7 %
STONE WEIGHT KSTONE: 115 mg

## 2015-09-30 ENCOUNTER — Ambulatory Visit
Admission: RE | Admit: 2015-09-30 | Discharge: 2015-09-30 | Disposition: A | Discharge status: Home / Self Care | Payer: BC Managed Care – PPO | Source: Ambulatory Visit | Attending: Urology | Admitting: Urology

## 2015-09-30 DIAGNOSIS — N200 Calculus of kidney: Secondary | ICD-10-CM | POA: Insufficient documentation

## 2015-09-30 DIAGNOSIS — N281 Cyst of kidney, acquired: Secondary | ICD-10-CM | POA: Insufficient documentation

## 2015-10-05 ENCOUNTER — Telehealth: Payer: Self-pay | Admitting: Urology

## 2015-10-05 NOTE — Telephone Encounter (Signed)
Patient called back and I explained the process of getting tests results. Patient understands and ok with plan.

## 2015-10-05 NOTE — Telephone Encounter (Signed)
LMOM for patient to return call. Let patient know that as soon as the provider gets the report and reviews it we will give him a call. Have no message to call him about [place] at this point.

## 2015-10-05 NOTE — Telephone Encounter (Signed)
Pt called inquiring about his US done on Friday 09/30/2015. Can be reached on work # during normal business hours. Please advise. Thanks.

## 2015-10-19 ENCOUNTER — Ambulatory Visit (INDEPENDENT_AMBULATORY_CARE_PROVIDER_SITE_OTHER): Payer: BC Managed Care – PPO | Admitting: Urology

## 2015-10-19 ENCOUNTER — Encounter: Payer: Self-pay | Admitting: Urology

## 2015-10-19 ENCOUNTER — Telehealth: Payer: Self-pay | Admitting: Urology

## 2015-10-19 VITALS — BP 142/90 | HR 46 | Ht 74.0 in | Wt 243.9 lb

## 2015-10-19 DIAGNOSIS — N201 Calculus of ureter: Secondary | ICD-10-CM | POA: Diagnosis not present

## 2015-10-19 DIAGNOSIS — R600 Localized edema: Secondary | ICD-10-CM | POA: Diagnosis not present

## 2015-10-19 DIAGNOSIS — N200 Calculus of kidney: Secondary | ICD-10-CM

## 2015-10-19 DIAGNOSIS — R3129 Other microscopic hematuria: Secondary | ICD-10-CM | POA: Diagnosis not present

## 2015-10-19 DIAGNOSIS — I10 Essential (primary) hypertension: Secondary | ICD-10-CM | POA: Insufficient documentation

## 2015-10-19 DIAGNOSIS — N132 Hydronephrosis with renal and ureteral calculous obstruction: Secondary | ICD-10-CM | POA: Diagnosis not present

## 2015-10-19 DIAGNOSIS — Z8042 Family history of malignant neoplasm of prostate: Secondary | ICD-10-CM

## 2015-10-19 NOTE — Telephone Encounter (Signed)
Would you call [facility] and get a 24 hour urine for this patient?

## 2015-10-19 NOTE — Progress Notes (Signed)
11:47 AM   Juan Dunlap 1955-07-08 [id number]  Referring provider: Maryland Pink, MD [address] [facility] [place], [place] [address]  Chief Complaint  Patient presents with  . Follow-up    HPI: Patient is a 60 year old Caucasian male who presents today for to discuss his RUS report and discuss definitive treatment for his renal stones.    Nephrolithiasis Patient spontaneously passed right ureteral stones two months ago.  Repeat CT scan noted interval resolution of the right hydronephrosis and distal right ureteral calculi.  The small 3 mm stone in the right kidney and the left 10 mm stone in the left kidney remained.  He then underwent Left URS/LL/ureteral stent placement and ureteral stent removal.  He did experience LLE after surgery of the left leg.  He has not had any further flank pain since the stent was removed.    RUS performed on 09/30/2015 noted no kidney stones or obstructive uropathy identified.  Right kidney cysts.  I have personally reviewed the films.   Stone composition was noted to be 90% calcium oxalate monohydrate, 3% calcium oxalate dihydrate and 7% calcium phosphate.    PMH: Past Medical History:  Diagnosis Date  . Hypertension    borderline-pt states it is higher in md office but home checks are 140's/70's-PT CONTROLS BP WITH DIET AND EXERCISE PER PT  . Kidney stones     Surgical History: Past Surgical History:  Procedure Laterality Date  . CYSTOSCOPY W/ RETROGRADES Bilateral 09/07/2015   Procedure: CYSTOSCOPY WITH RETROGRADE PYELOGRAM;  Surgeon: Hollice Espy, MD;  Location: [facility];  Service: Urology;  Laterality: Bilateral;  . CYSTOSCOPY WITH STENT PLACEMENT Left 09/07/2015   Procedure: CYSTOSCOPY WITH STENT PLACEMENT;  Surgeon: Hollice Espy, MD;  Location: [facility];  Service: Urology;  Laterality: Left;  . URETEROSCOPY WITH HOLMIUM LASER LITHOTRIPSY Left 09/07/2015   Procedure: URETEROSCOPY WITH HOLMIUM LASER LITHOTRIPSY;  Surgeon:  Hollice Espy, MD;  Location: [facility];  Service: Urology;  Laterality: Left;  . WISDOM TOOTH EXTRACTION      Home Medications:    Medication List       Accurate as of 10/19/15 11:47 AM. Always use your most recent med list.          cholecalciferol 1000 units tablet Commonly known as:  VITAMIN D Take 1,000 Units by mouth daily.   docusate sodium 100 MG capsule Commonly known as:  COLACE Take 1 capsule (100 mg total) by mouth 2 (two) times daily.   HYDROcodone-acetaminophen 5-325 MG tablet Commonly known as:  NORCO/VICODIN Take 1-2 tablets by mouth every 6 (six) hours as needed for moderate pain.   multivitamin with minerals Tabs tablet Take 1 tablet by mouth daily.   oxybutynin 5 MG tablet Commonly known as:  DITROPAN Take 1 tablet (5 mg total) by mouth every 8 (eight) hours as needed for bladder spasms.   oxyCODONE 5 MG immediate release tablet Commonly known as:  ROXICODONE Take 1 tablet (5 mg total) by mouth every 6 (six) hours as needed for moderate pain. Do not drive while taking this medication.   tamsulosin 0.4 MG Caps capsule Commonly known as:  FLOMAX Take 1 capsule (0.4 mg total) by mouth daily.       Allergies: No Known Allergies  Family History: Family History  Problem Relation Age of Onset  . Prostate cancer Father   . Hematuria Father   . Kidney Stones Father   . Hematuria Brother   . Kidney Stones Brother   .  Kidney disease Neg Hx     Social History:  reports that he has never smoked. He has never used smokeless tobacco. He reports that he does not drink alcohol or use drugs.  ROS: UROLOGY Frequent Urination?: No Hard to postpone urination?: No Burning/pain with urination?: No Get up at night to urinate?: No Leakage of urine?: No Urine stream starts and stops?: No Trouble starting stream?: No Do you have to strain to urinate?: No Blood in urine?: No Urinary tract infection?: No Sexually transmitted disease?: No Injury to kidneys  or bladder?: No Painful intercourse?: No Weak stream?: No Erection problems?: No Penile pain?: No  Gastrointestinal Nausea?: No Vomiting?: No Indigestion/heartburn?: No Diarrhea?: No Constipation?: No  Constitutional Fever: No Night sweats?: No Weight loss?: No Fatigue?: No  Skin Skin rash/lesions?: No Itching?: No  Eyes Blurred vision?: No Double vision?: No  Ears/Nose/Throat Sore throat?: No Sinus problems?: No  Hematologic/Lymphatic Swollen glands?: No Easy bruising?: No  Cardiovascular Leg swelling?: No Chest pain?: No  Respiratory Cough?: No Shortness of breath?: No  Endocrine Excessive thirst?: No  Musculoskeletal Back pain?: No Joint pain?: No  Neurological Headaches?: No Dizziness?: No  Psychologic Depression?: No Anxiety?: No  Physical Exam: BP (!) 142/90 (BP Location: Left Arm, Patient Position: Sitting, Cuff Size: Large)   Pulse (!) 46   Ht 6\' 2"  (1.88 m)   Wt 243 lb 14.4 oz (110.6 kg)   BMI 31.31 kg/m   Constitutional: Well nourished. Alert and oriented, No acute distress. HEENT: [place] AT, moist mucus membranes. Trachea midline, no masses. Cardiovascular: No clubbing, cyanosis, or edema. Respiratory: Normal respiratory effort, no increased work of breathing. Skin: No rashes, bruises or suspicious lesions. Lymph: No cervical or inguinal adenopathy. Neurologic: Grossly intact, no focal deficits, moving all 4 extremities. Psychiatric: Normal mood and affect.  Laboratory Data: Lab Results  Component Value Date   WBC 8.3 05/21/2015   HGB 15.3 05/21/2015   HCT 43.6 05/21/2015   MCV 83.8 05/21/2015   PLT 194 05/21/2015    Lab Results  Component Value Date   CREATININE 1.13 05/21/2015    Lab Results  Component Value Date   AST 30 05/21/2015   Lab Results  Component Value Date   ALT 24 05/21/2015    Pertinent Imaging CLINICAL DATA:  Left lower extremity edema.  EXAM: LEFT LOWER EXTREMITY VENOUS DOPPLER  ULTRASOUND  TECHNIQUE: Gray-scale sonography with graded compression, as well as color Doppler and duplex ultrasound were performed to evaluate the lower extremity deep venous systems from the level of the common femoral vein and including the common femoral, femoral, profunda femoral, popliteal and calf veins including the posterior tibial, peroneal and gastrocnemius veins when visible. The superficial great saphenous vein was also interrogated. Spectral Doppler was utilized to evaluate flow at rest and with distal augmentation maneuvers in the common femoral, femoral and popliteal veins.  COMPARISON:  None.  FINDINGS: Contralateral Common Femoral Vein: Respiratory phasicity is normal and symmetric with the symptomatic side. No evidence of thrombus. Normal compressibility.  Common Femoral Vein: No evidence of thrombus. Normal compressibility, respiratory phasicity and response to augmentation.  Saphenofemoral Junction: No evidence of thrombus. Normal compressibility and flow on color Doppler imaging.  Profunda Femoral Vein: No evidence of thrombus. Normal compressibility and flow on color Doppler imaging.  Femoral Vein: No evidence of thrombus. Normal compressibility, respiratory phasicity and response to augmentation.  Popliteal Vein: No evidence of thrombus. Normal compressibility, respiratory phasicity and response to augmentation.  Calf Veins: No evidence  of thrombus. Normal compressibility and flow on color Doppler imaging.  Superficial Great Saphenous Vein: No evidence of thrombus. Normal compressibility and flow on color Doppler imaging.  Venous Reflux:  None.  Other Findings:  None.  IMPRESSION: No evidence of deep venous thrombosis of the left lower extremity.   Electronically Signed   By: Fidela Salisbury M.D.   On: 09/15/2015 12:04   CLINICAL DATA:  History of renal stone with surgery to remove stone 2 weeks ago.  EXAM: RENAL /  URINARY TRACT ULTRASOUND COMPLETE  COMPARISON:  06/09/2015  FINDINGS:  Right Kidney:  Length: 10.2 cm. Echogenicity within normal limits. No mass or hydronephrosis visualized. Cyst is noted measuring 3.3 x 2.7 x 2.9 cm.  Left Kidney:  Length: 10.0 cm . Echogenicity within normal limits. No mass or hydronephrosis visualized.  Bladder:  Appears normal for degree of bladder distention.  IMPRESSION: 1. No kidney stones or obstructive uropathy identified. 2. Right kidney cysts.   Electronically Signed   By: Kerby Moors M.D.   On: 09/30/2015 14:16   Assessment & Plan:    1. Left renal stone  - Stone composition was noted to be 90% calcium oxalate monohydrate, 3% calcium oxalate dihydrate and 7% calcium phosphate.    2. Right UVJ stone   - RUS performed on 09/30/2015 noted no hydronephrosis   - Resolved.  3. Hydronephrosis  - No right or left hydronephrosis seen on recent RUS   4. Microscopic hematuria  - no further hematuria  5. Family history of prostate cancer  - PSA was found to be 1.4 on 08/16/2015  6. History of nephrolithiasis  - patient would like to pursue a 24 hour urine study  - RTC for report  7. LLE  - dopplers negative for clots     Return for pending 24 hour urine.  These notes generated with voice recognition software. I apologize for typographical errors.  Zara Council, [facility] [address] [place], [place] [address] [phone]

## 2015-10-19 NOTE — Telephone Encounter (Signed)
L[facility]ol[facility] ordered

## 2015-10-31 ENCOUNTER — Other Ambulatory Visit: Payer: BC Managed Care – PPO

## 2015-11-09 ENCOUNTER — Other Ambulatory Visit: Payer: Self-pay | Admitting: Urology

## 2015-11-10 ENCOUNTER — Telehealth: Payer: Self-pay

## 2015-11-10 NOTE — Telephone Encounter (Signed)
-----   Message from Nori Riis, PA-C sent at 11/09/2015 11:43 AM EDT ----- Please call patient and let him know that his [facility] report is available and he can make an appointment to discuss the results.

## 2015-11-10 NOTE — Telephone Encounter (Signed)
LMOM

## 2015-11-11 NOTE — Telephone Encounter (Signed)
Pt returned the call. Results appt was made.

## 2015-11-11 NOTE — Telephone Encounter (Signed)
LMOM

## 2015-11-21 ENCOUNTER — Ambulatory Visit: Payer: BC Managed Care – PPO | Admitting: Urology

## 2015-11-22 ENCOUNTER — Ambulatory Visit (INDEPENDENT_AMBULATORY_CARE_PROVIDER_SITE_OTHER): Payer: BC Managed Care – PPO | Admitting: Urology

## 2015-11-22 ENCOUNTER — Encounter: Payer: Self-pay | Admitting: Urology

## 2015-11-22 VITALS — BP 132/79 | HR 52 | Ht 74.0 in | Wt 242.5 lb

## 2015-11-22 DIAGNOSIS — E7253 Hyperoxaluria: Secondary | ICD-10-CM | POA: Diagnosis not present

## 2015-11-22 DIAGNOSIS — Z8042 Family history of malignant neoplasm of prostate: Secondary | ICD-10-CM | POA: Diagnosis not present

## 2015-11-22 DIAGNOSIS — Z87442 Personal history of urinary calculi: Secondary | ICD-10-CM

## 2015-11-22 DIAGNOSIS — N200 Calculus of kidney: Secondary | ICD-10-CM | POA: Diagnosis not present

## 2015-11-22 DIAGNOSIS — R82992 Hyperoxaluria: Secondary | ICD-10-CM

## 2015-11-22 DIAGNOSIS — R82994 Hypercalciuria: Secondary | ICD-10-CM

## 2015-11-23 LAB — TSH: TSH: 3.14 u[IU]/mL (ref 0.450–4.500)

## 2015-11-23 LAB — VITAMIN D PNL(25-HYDRXY+1,25-DIHY)-BLD
VIT D 25 HYDROXY: 38.7 ng/mL (ref 30.0–100.0)
Vit D, 1,25-Dihydroxy: 70.7 pg/mL (ref 19.9–79.3)

## 2015-11-24 ENCOUNTER — Telehealth: Payer: Self-pay

## 2015-11-24 NOTE — Telephone Encounter (Signed)
-----   Message from Nori Riis, PA-C sent at 11/23/2015  5:03 PM EDT ----- Please tell the patient that his Vitamin D is normal.

## 2015-11-24 NOTE — Telephone Encounter (Signed)
LMOM

## 2015-11-24 NOTE — Telephone Encounter (Signed)
-----   Message from Nori Riis, PA-C sent at 11/23/2015  1:45 PM EDT ----- Thyroid test is good.  Vitamin D is still pending.

## 2015-11-28 NOTE — Telephone Encounter (Signed)
Spoke with pt in reference to test results. Pt voiced understanding.

## 2015-12-04 NOTE — Progress Notes (Signed)
6:32 PM   Juan Dunlap 01-08-1956 [id number]  Referring provider: Maryland Pink, MD [address] [facility] [place], [place] [address]  Chief Complaint  Patient presents with  . Follow-up    litholink result   . Recurrent UTI    HPI: Patient is a 60 year old Caucasian male who presents today for to discuss his 24 hour urine results.     Nephrolithiasis Patient spontaneously passed a right ureteral stones.  Repeat CT scan performed on 06/09/2015 noted interval resolution of the right hydronephrosis and distal right ureteral calculi.  The small 3 mm stone in the right kidney and the left 10 mm stone in the left kidney remained.  He then underwent Left URS/LL/ureteral stent placement and ureteral stent removal in 08/2015.  He did experience LLE pain after surgery of the left leg, dopplers performed on 09/15/2015 did not note any DVT's.   RUS performed on 09/30/2015 noted no kidney stones or obstructive uropathy identified.  Right kidney cysts.  Stone composition was noted to be 90% calcium oxalate monohydrate, 3% calcium oxalate dihydrate and 7% calcium phosphate.    Today, the patient does not have any complaints. He denies dysuria, gross hematuria or suprapubic pain. He also denies any flank pain, fever, nausea, chills or vomiting.  Patient 24 hour urine report notes stone risk factors positive for hypercalciuria, hyper oxaluria, mild calcium oxalate stone risk and a mild calcium phosphate stone risk.  Cystine screening is negative.     PMH: Past Medical History:  Diagnosis Date  . Hypertension    borderline-pt states it is higher in md office but home checks are 140's/70's-PT CONTROLS BP WITH DIET AND EXERCISE PER PT  . Kidney stones     Surgical History: Past Surgical History:  Procedure Laterality Date  . CYSTOSCOPY W/ RETROGRADES Bilateral 09/07/2015   Procedure: CYSTOSCOPY WITH RETROGRADE PYELOGRAM;  Surgeon: Hollice Espy, MD;  Location: [facility] OR[facility];  Service:  Urology;  Laterality: Bilateral;  . CYSTOSCOPY WITH STENT PLACEMENT Left 09/07/2015   Procedure: CYSTOSCOPY WITH STENT PLACEMENT;  Surgeon: Hollice Espy, MD;  Location: [facility];  Service: Urology;  Laterality: Left;  . URETEROSCOPY WITH HOLMIUM LASER LITHOTRIPSY Left 09/07/2015   Procedure: URETEROSCOPY WITH HOLMIUM LASER LITHOTRIPSY;  Surgeon: Hollice Espy, MD;  Location: [facility] ORS;  Service: Urology;  Laterality: Left;  . WISDOM TOOTH EXTRACTION      Home Medications:    Medication List       Accurate as of 11/22/15 11:59 PM. Always use your most recent med list.          Milk Thistle 140 MG Caps Take by mouth.   MULTI-VITAMINS Tabs Take by mouth.   Vitamin D3 2000 units capsule Take by mouth.       Allergies: No Known Allergies  Family History: Family History  Problem Relation Age of Onset  . Prostate cancer Father   . Hematuria Father   . Kidney Stones Father   . Hematuria Brother   . Kidney Stones Brother   . Kidney disease Neg Hx     Social History:  reports that he has never smoked. He has never used smokeless tobacco. He reports that he does not drink alcohol or use drugs.  ROS: UROLOGY Frequent Urination?: No Hard to postpone urination?: No Burning/pain with urination?: No Get up at night to urinate?: No Leakage of urine?: No Urine stream starts and stops?: No Trouble starting stream?: No Do you have to strain to urinate?: No  Blood in urine?: No Urinary tract infection?: No Sexually transmitted disease?: No Injury to kidneys or bladder?: No Painful intercourse?: No Weak stream?: No Erection problems?: No Penile pain?: No  Gastrointestinal Nausea?: No Vomiting?: No Indigestion/heartburn?: No Diarrhea?: No Constipation?: No  Constitutional Fever: No Night sweats?: No Weight loss?: No Fatigue?: No  Skin Skin rash/lesions?: No Itching?: No  Eyes Blurred vision?: No Double vision?: No  Ears/Nose/Throat Sore throat?: No Sinus  problems?: No  Hematologic/Lymphatic Swollen glands?: No Easy bruising?: No  Cardiovascular Leg swelling?: No Chest pain?: No  Respiratory Cough?: No Shortness of breath?: No  Endocrine Excessive thirst?: No  Musculoskeletal Back pain?: No Joint pain?: No  Neurological Headaches?: No Dizziness?: No  Psychologic Depression?: No Anxiety?: No  Physical Exam: BP 132/79   Pulse (!) 52   Ht 6\' 2"  (1.88 m)   Wt 242 lb 8 oz (110 kg)   BMI 31.14 kg/m   Constitutional: Well nourished. Alert and oriented, No acute distress. HEENT: [place] AT, moist mucus membranes. Trachea midline, no masses. Cardiovascular: No clubbing, cyanosis, or edema. Respiratory: Normal respiratory effort, no increased work of breathing. Skin: No rashes, bruises or suspicious lesions. Lymph: No cervical or inguinal adenopathy. Neurologic: Grossly intact, no focal deficits, moving all 4 extremities. Psychiatric: Normal mood and affect.  Laboratory Data: Lab Results  Component Value Date   WBC 8.3 05/21/2015   HGB 15.3 05/21/2015   HCT 43.6 05/21/2015   MCV 83.8 05/21/2015   PLT 194 05/21/2015    Lab Results  Component Value Date   CREATININE 1.13 05/21/2015    Lab Results  Component Value Date   AST 30 05/21/2015   Lab Results  Component Value Date   ALT 24 05/21/2015    Assessment & Plan:    1. History of nephrolithiasis  - Stone composition was noted to be 90% calcium oxalate monohydrate, 3% calcium oxalate dihydrate and 7% calcium phosphate.    2. Hypercalciuria  - Patient states that he is taking vitamin D3, we will check a vitamin D level  - Patient denies any history of hyperthyroidism, we will check TSH  - Advise patient to keep diet sodium 2300 to 3500/mg a day  - Advise patient to obtain [phone] mg a day of calcium from food sources  - Advise patient to limit protein consumption to 0.8-1.3 g/kg/day per day  - Patient has recently started a vegetarian diet 2 months  ago  3. Hyperoxaluria  - Patient is slightly at risk for high oxalate in his diet since he has pursued a vegetarian lifestyle  - Patient is given a list of oxalate content of common foods  4. Mild CaOx stone risk  - Patient continues to drink 10-12 cups of water daily  5. Family history of prostate cancer  - PSA was found to be 1.4 on 08/16/2015  - Patient is advised to continue yearly PSA screenings and digital rectal exams, he may do this through his primary care physician or we would happily perform this screening   Return for pending labs.  These notes generated with voice recognition software. I apologize for typographical errors.  Zara Council, [facility] [address] [place], [place] [address] [phone]

## 2016-02-23 ENCOUNTER — Encounter: Payer: Self-pay | Admitting: Urology

## 2016-07-20 ENCOUNTER — Encounter: Payer: Self-pay | Admitting: Podiatry

## 2016-07-20 ENCOUNTER — Ambulatory Visit (INDEPENDENT_AMBULATORY_CARE_PROVIDER_SITE_OTHER): Payer: BC Managed Care – PPO

## 2016-07-20 ENCOUNTER — Ambulatory Visit (INDEPENDENT_AMBULATORY_CARE_PROVIDER_SITE_OTHER): Payer: BC Managed Care – PPO | Admitting: Podiatry

## 2016-07-20 DIAGNOSIS — M722 Plantar fascial fibromatosis: Secondary | ICD-10-CM

## 2016-07-20 DIAGNOSIS — M79671 Pain in right foot: Secondary | ICD-10-CM | POA: Diagnosis not present

## 2016-07-20 MED ORDER — BETAMETHASONE SOD PHOS & ACET 6 (3-3) MG/ML IJ SUSP: 3.0000 mg | Freq: Once | INTRAMUSCULAR | Status: AC

## 2016-07-20 MED ORDER — DICLOFENAC SODIUM 75 MG PO TBEC: 75.0000 mg | DELAYED_RELEASE_TABLET | Freq: Two times a day (BID) | ORAL | 0 refills | Status: DC

## 2016-07-20 NOTE — Progress Notes (Signed)
   Subjective:    Patient ID: Juan Dunlap, male    DOB: [birth year], 61 y.o.   MRN: [id number]  HPI    Review of Systems  All other systems reviewed and are negative.      Objective:   Physical Exam        Assessment & Plan:

## 2016-07-20 NOTE — Progress Notes (Signed)
Patient ID: Juan Dunlap, male   DOB: 07-11-1955, 61 y.o.   MRN: [id number]   Subjective: Patient presents today for pain and tenderness in the right foot. Patient states that his right heels been hurting for the past 6 months. He does have a history of plantar fasciitis to the left foot. He states that he past therapy alleviate his symptoms to the left foot. Extensive walking aggravates his right heel pain  Objective: Physical Exam General: The patient is alert and oriented x3 in no acute distress.  Dermatology: Skin is warm, dry and supple bilateral lower extremities. Negative for open lesions or macerations bilateral.   Vascular: Dorsalis Pedis and Posterior Tibial pulses palpable bilateral.  Capillary fill time is immediate to all digits.  Neurological: Epicritic and protective threshold intact bilateral.   Musculoskeletal: Tenderness to palpation at the medial calcaneal tubercale and through the insertion of the plantar fascia of the right foot. All other joints range of motion within normal limits bilateral. Strength 5/5 in all groups bilateral.   Radiographic exam: Normal osseous mineralization. Joint spaces preserved. No fracture/dislocation/boney destruction. Calcaneal spur present with mild thickening of plantar fascia right. No other soft tissue abnormalities or radiopaque foreign bodies.   Assessment: 1. Plantar fasciitis right 2. Pain in right foot  Plan of Care:  1. Patient evaluated. Xrays reviewed.   2. Injection of 0.5cc Celestone soluspan injected into the right heel at the insertion of the plantar fascia.  3. Instructed patient regarding therapies and modalities at home to alleviate symptoms.  4. Rx for diclofenac 75 mg PO dispensed   5. Plantar fascial band(s) dispensed. 7. Return to clinic in 4 weeks.  The patient is not better after 2 weeks we will proceed to EPAT therapy since it worked to his left foot,    Edrick Kins, DPM [facility]  Dr.  Edrick Kins, [address]                                        [place], [place] [address]                Office [phone]  Fax [phone]

## 2016-08-24 ENCOUNTER — Encounter: Payer: Self-pay | Admitting: Podiatry

## 2016-08-24 ENCOUNTER — Ambulatory Visit (INDEPENDENT_AMBULATORY_CARE_PROVIDER_SITE_OTHER): Payer: BC Managed Care – PPO | Admitting: Podiatry

## 2016-08-24 DIAGNOSIS — M722 Plantar fascial fibromatosis: Secondary | ICD-10-CM

## 2016-08-24 NOTE — Progress Notes (Signed)
   Subjective: Patient presents today for follow-up treatment and evaluation of plantar fasciitis to the right foot. Patient states that he feels much better. He denies pain and only feels tight occasionally on the medial heel. Patient states that he's approximately 90-95 percent better. Patient presents today for further treatment and evaluation  Objective: Physical Exam General: The patient is alert and oriented x3 in no acute distress.  Dermatology: Skin is warm, dry and supple bilateral lower extremities. Negative for open lesions or macerations bilateral.   Vascular: Dorsalis Pedis and Posterior Tibial pulses palpable bilateral.  Capillary fill time is immediate to all digits.  Neurological: Epicritic and protective threshold intact bilateral.   Musculoskeletal: Minimal Tenderness to palpation at the medial calcaneal tubercale and through the insertion of the plantar fascia of the right foot. All other joints range of motion within normal limits bilateral. Strength 5/5 in all groups bilateral.   Assessment: 1. Plantar fasciitis right 2. Pain in right foot  Plan of Care:  1. Patient evaluated.  2. Recommend the patient continue all conservative modalities including good shoe gear, plantar fascial brace, oral anti-inflammatory diclofenac when necessary, and stretching exercises 3. Negative patient the option of one last injection however he states that he's doing well and doesn't feel that he needs it. 4. Return to clinic when necessary or if symptoms flareup  Edrick Kins, DPM [facility]  Dr. Edrick Kins, DPM    [address], [place] [address]                Office [phone]  Fax [phone]

## 2017-05-24 ENCOUNTER — Ambulatory Visit: Payer: BC Managed Care – PPO | Admitting: Podiatry

## 2017-05-24 ENCOUNTER — Encounter: Payer: Self-pay | Admitting: Podiatry

## 2017-05-24 DIAGNOSIS — M722 Plantar fascial fibromatosis: Secondary | ICD-10-CM | POA: Diagnosis not present

## 2017-05-27 NOTE — Progress Notes (Signed)
   Subjective: 62 year old male presenting today with a chief complaint of a plantar fasciitis flare up of the right foot that began about three months ago. He states he has had an injection in the past which helped alleviate the pain for about one month. He has been wearing a night splint and stretching which helps alleviate the symptoms. Walking and standing for long periods of time increases the pain. Patient is here for further evaluation and treatment.   Past Medical History:  Diagnosis Date  . Hypertension    borderline-pt states it is higher in md office but home checks are 140's/70's-PT CONTROLS BP WITH DIET AND EXERCISE PER PT  . Kidney stones      Objective: Physical Exam General: The patient is alert and oriented x3 in no acute distress.  Dermatology: Skin is warm, dry and supple bilateral lower extremities. Negative for open lesions or macerations bilateral.   Vascular: Dorsalis Pedis and Posterior Tibial pulses palpable bilateral.  Capillary fill time is immediate to all digits.  Neurological: Epicritic and protective threshold intact bilateral.   Musculoskeletal: Tenderness to palpation to the plantar aspect of the right heel along the plantar fascia. All other joints range of motion within normal limits bilateral. Strength 5/5 in all groups bilateral.    Assessment: 1. Plantar fasciitis right 2. Pain in right foot  Plan of Care:  1. Patient evaluated.  2. Appointment with Janett Billow for EPAT.  3. Return to clinic in 3 months.      Edrick Kins, DPM [facility]  Dr. Edrick Kins, DPM    [address], [place] [address]                Office [phone]  Fax [phone]

## 2017-06-11 ENCOUNTER — Ambulatory Visit (INDEPENDENT_AMBULATORY_CARE_PROVIDER_SITE_OTHER): Payer: BC Managed Care – PPO

## 2017-06-11 DIAGNOSIS — M79676 Pain in unspecified toe(s): Secondary | ICD-10-CM

## 2017-06-11 DIAGNOSIS — M722 Plantar fascial fibromatosis: Secondary | ICD-10-CM

## 2017-06-18 ENCOUNTER — Ambulatory Visit (INDEPENDENT_AMBULATORY_CARE_PROVIDER_SITE_OTHER): Payer: BC Managed Care – PPO

## 2017-06-18 DIAGNOSIS — M79676 Pain in unspecified toe(s): Secondary | ICD-10-CM

## 2017-06-18 DIAGNOSIS — M722 Plantar fascial fibromatosis: Secondary | ICD-10-CM

## 2017-06-25 NOTE — Progress Notes (Signed)
Patient presents with ongoing pain in his right foot, recently diagnosed with plantar fasciitis. He has tried various treatments with not much success at pain relief. He is currently trying night splinting and stretching, also wearing supportive shoes.   Pain and tenderness on palpation to right heel  ESWT administered to area for 12 joules and tolerated well. EPAT administered to surrounding area. Advised on boot usage and avoidance of NSAIDs and ice. He is to follow up in 1 week for 2nd treatment

## 2017-06-28 ENCOUNTER — Ambulatory Visit (INDEPENDENT_AMBULATORY_CARE_PROVIDER_SITE_OTHER): Payer: BC Managed Care – PPO

## 2017-06-28 DIAGNOSIS — M79676 Pain in unspecified toe(s): Secondary | ICD-10-CM

## 2017-06-28 DIAGNOSIS — M722 Plantar fascial fibromatosis: Secondary | ICD-10-CM

## 2017-07-02 NOTE — Progress Notes (Signed)
Patient presents with ongoing pain in his right foot, recently diagnosed with plantar fasciitis. He has tried various treatments with not much success at pain relief. He is currently trying night splinting and stretching, also wearing supportive shoes. He states that he did have a couple days of pain relief  Pain and tenderness on palpation to right heel  ESWT administered to area for 12 joules and tolerated well. EPAT administered to surrounding area. Advised on boot usage and avoidance of NSAIDs and ice. He is to follow up in 1 week for 3rd treatment

## 2017-07-05 NOTE — Progress Notes (Signed)
Patient presents with ongoing pain in his right foot, recently diagnosed with plantar fasciitis. He has tried various treatments with not much success at pain relief. He is currently trying night splinting and stretching, also wearing supportive shoes. He states that he did have a couple days of pain relief but the pain does return  Pain and tenderness on palpation to right heel  ESWT administered to area for 13 joules and tolerated well. EPAT administered to surrounding area. Advised on boot usage and avoidance of NSAIDs and ice. He is to follow up in 4 weeks for 4th treatment

## 2017-07-29 ENCOUNTER — Ambulatory Visit: Payer: Self-pay

## 2017-07-29 DIAGNOSIS — M79676 Pain in unspecified toe(s): Secondary | ICD-10-CM

## 2017-07-29 DIAGNOSIS — M722 Plantar fascial fibromatosis: Secondary | ICD-10-CM

## 2017-08-02 NOTE — Progress Notes (Signed)
Patient presents with ongoing pain in his right foot, recently diagnosed with plantar fasciitis. He has tried various treatments with not much success at pain relief. He is currently trying night splinting and stretching, also wearing supportive shoes. He states that he has noticed a lot of improvement but pain does return with prolonged walking.  Pain and tenderness on palpation to right heel  ESWT administered to area for 13 joules and tolerated well. EPAT administered to surrounding area. Advised on boot usage and avoidance of NSAIDs and ice. He is to follow up in 2 weeks for 5th treatment

## 2017-08-12 ENCOUNTER — Other Ambulatory Visit: Payer: BC Managed Care – PPO

## 2017-08-30 ENCOUNTER — Ambulatory Visit (INDEPENDENT_AMBULATORY_CARE_PROVIDER_SITE_OTHER): Payer: BC Managed Care – PPO

## 2017-08-30 DIAGNOSIS — M79676 Pain in unspecified toe(s): Secondary | ICD-10-CM

## 2017-08-30 DIAGNOSIS — M722 Plantar fascial fibromatosis: Secondary | ICD-10-CM

## 2017-09-10 NOTE — Progress Notes (Signed)
Patient presents with ongoing pain in his right foot, recently diagnosed with plantar fasciitis. He has tried various treatments with not much success at pain relief. He is currently trying night splinting and stretching, also wearing supportive shoes. He states that he has noticed a lot of improvement but pain does return with prolonged walking, overall pain is improving with each visit.  Pain and tenderness on palpation to right heel  ESWT administered to area for 15 joules and tolerated well. EPAT administered to surrounding area. Advised on boot usage and avoidance of NSAIDs and ice. He is to follow-up in 2 weeks for 6 treatment.

## 2017-09-13 ENCOUNTER — Ambulatory Visit (INDEPENDENT_AMBULATORY_CARE_PROVIDER_SITE_OTHER): Payer: BC Managed Care – PPO

## 2017-09-13 DIAGNOSIS — M79676 Pain in unspecified toe(s): Secondary | ICD-10-CM

## 2017-09-13 DIAGNOSIS — M722 Plantar fascial fibromatosis: Secondary | ICD-10-CM

## 2017-09-24 NOTE — Progress Notes (Signed)
Patient presents with ongoing pain in his right foot, recently diagnosed with plantar fasciitis. He has tried various treatments with not much success at pain relief. He is currently trying night splinting and stretching, also wearing supportive shoes. He states that he has noticed a lot of improvement but pain does return with prolonged walking, overall pain is improving with each visit. 80% better  Pain and tenderness on palpation to right heel  ESWT administered to area for 15 joules and tolerated well. EPAT administered to surrounding area. Advised on boot usage and avoidance of NSAIDs and ice. He is to follow-up prn if symptoms fail to improve.

## 2017-10-21 ENCOUNTER — Ambulatory Visit: Payer: BC Managed Care – PPO | Admitting: Podiatry

## 2018-11-07 ENCOUNTER — Other Ambulatory Visit: Payer: Self-pay

## 2018-11-07 ENCOUNTER — Emergency Department
Admission: EM | Admit: 2018-11-07 | Discharge: 2018-11-07 | Disposition: A | Discharge status: Home / Self Care | Payer: BC Managed Care – PPO | Attending: Emergency Medicine | Admitting: Emergency Medicine

## 2018-11-07 DIAGNOSIS — I10 Essential (primary) hypertension: Secondary | ICD-10-CM | POA: Diagnosis not present

## 2018-11-07 DIAGNOSIS — I4892 Unspecified atrial flutter: Secondary | ICD-10-CM

## 2018-11-07 DIAGNOSIS — R000 Tachycardia, unspecified: Secondary | ICD-10-CM | POA: Diagnosis present

## 2018-11-07 LAB — CBC
HCT: 46.6 % (ref 39.0–52.0)
Hemoglobin: 16 g/dL (ref 13.0–17.0)
MCH: 29.7 pg (ref 26.0–34.0)
MCHC: 34.3 g/dL (ref 30.0–36.0)
MCV: 86.5 fL (ref 80.0–100.0)
Platelets: 197 K/uL (ref 150–400)
RBC: 5.39 MIL/uL (ref 4.22–5.81)
RDW: 12.7 % (ref 11.5–15.5)
WBC: 7.5 K/uL (ref 4.0–10.5)
nRBC: 0 % (ref 0.0–0.2)

## 2018-11-07 LAB — BASIC METABOLIC PANEL
Anion gap: 9 (ref 5–15)
BUN: 20 mg/dL (ref 8–23)
CO2: 25 mmol/L (ref 22–32)
Calcium: 9.5 mg/dL (ref 8.9–10.3)
Chloride: 107 mmol/L (ref 98–111)
Creatinine, Ser: 0.88 mg/dL (ref 0.61–1.24)
GFR calc Af Amer: >60 mL/min (ref >60)
GFR calc non Af Amer: >60 mL/min (ref >60)
Glucose, Bld: 110 mg/dL — ABNORMAL HIGH (ref 70–99)
Potassium: 4 mmol/L (ref 3.5–5.1)
Sodium: 141 mmol/L (ref 135–145)

## 2018-11-07 LAB — TROPONIN I (HIGH SENSITIVITY): Troponin I (High Sensitivity): 32 ng/L — ABNORMAL HIGH (ref <18)

## 2018-11-07 MED ORDER — DILTIAZEM HCL ER COATED BEADS 120 MG PO CP24: 120.0000 mg | ORAL_CAPSULE | Freq: Every day | ORAL | 1 refills | Status: DC

## 2018-11-07 MED ORDER — SODIUM CHLORIDE 0.9% FLUSH
3.0000 mL | Freq: Once | INTRAVENOUS | Status: AC
  Administered 2018-11-07: 3 mL via INTRAVENOUS

## 2018-11-07 MED ORDER — ASPIRIN EC 325 MG PO TBEC: 325.0000 mg | DELAYED_RELEASE_TABLET | Freq: Every day | ORAL | 1 refills | Status: DC

## 2018-11-07 MED ORDER — SODIUM CHLORIDE 0.9 % IV BOLUS
1000.0000 mL | Freq: Once | INTRAVENOUS | Status: AC
  Administered 2018-11-07: 1000 mL via INTRAVENOUS

## 2018-11-07 MED ORDER — DILTIAZEM HCL 25 MG/5ML IV SOLN
20.0000 mg | Freq: Once | INTRAVENOUS | Status: AC
  Administered 2018-11-07: 20 mg via INTRAVENOUS
  Filled 2018-11-07: qty 5

## 2018-11-07 NOTE — ED Notes (Signed)
Dr. Goodman at bedside.  

## 2018-11-07 NOTE — Discharge Instructions (Signed)
Please seek medical attention for any high fevers, chest pain, shortness of breath, change in behavior, persistent vomiting, bloody stool or any other new or concerning symptoms.  

## 2018-11-07 NOTE — ED Provider Notes (Signed)
[facility] Emergency Department Provider Note  ____________________________________________   I have reviewed the triage vital signs and the nursing notes.   HISTORY  Chief Complaint Fast heart rate  History limited by: Not Limited   HPI Juan Dunlap is a 63 y.o. male who presents to the emergency department today with concerns for fast heart rate as well as high blood pressure.  Patient states that throughout the day he has felt unwell.  Denies any pain or shortness of breath.  Because he felt unwell prior to working out he decided to check his blood pressure and his heart rate.  He found that his heart rate was elevated and that his blood pressure was elevated as well.  States normally his heart rate is in the 50s and he noted to be 125.  He denies similar symptoms in the past.  Denies any medication use.  Denies any excessive caffeine use.   Records reviewed. Per medical record review patient has a history of borderline HTN.   Past Medical History:  Diagnosis Date  . Hypertension    borderline-pt states it is higher in md office but home checks are 140's/70's-PT CONTROLS BP WITH DIET AND EXERCISE PER PT  . Kidney stones     Patient Active Problem List   Diagnosis Date Noted  . Hypertension 10/19/2015  . Kidney stones 06/11/2015  . Family history of prostate cancer 06/02/2015    Past Surgical History:  Procedure Laterality Date  . CYSTOSCOPY W/ RETROGRADES Bilateral 09/07/2015   Procedure: CYSTOSCOPY WITH RETROGRADE PYELOGRAM;  Surgeon: Hollice Espy, MD;  Location: [facility] ORS;  Service: Urology;  Laterality: Bilateral;  . CYSTOSCOPY WITH STENT PLACEMENT Left 09/07/2015   Procedure: CYSTOSCOPY WITH STENT PLACEMENT;  Surgeon: Hollice Espy, MD;  Location: [facility] ORS;  Service: Urology;  Laterality: Left;  . URETEROSCOPY WITH HOLMIUM LASER LITHOTRIPSY Left 09/07/2015   Procedure: URETEROSCOPY WITH HOLMIUM LASER LITHOTRIPSY;  Surgeon: Hollice Espy, MD;   Location: [facility] ORS;  Service: Urology;  Laterality: Left;  . WISDOM TOOTH EXTRACTION      Prior to Admission medications   Medication Sig Start Date End Date Taking? Authorizing Provider  Cholecalciferol (VITAMIN D3) 2000 units capsule Take by mouth.    [provider]  Coenzyme Q10-Vitamin E (VITALINE COQ10/VITAMIN E) 100-300 MG-UNIT WAFR Take by mouth.    [provider]  diclofenac (VOLTAREN) 75 MG EC tablet Take 1 tablet (75 mg total) by mouth 2 (two) times daily. 07/20/16   Edrick Kins, DPM  Milk Thistle 140 MG CAPS Take by mouth.    [provider]  Multiple Vitamin (MULTI-VITAMINS) TABS Take by mouth.    [provider]    Allergies Patient has no known allergies.  Family History  Problem Relation Age of Onset  . Prostate cancer Father   . Hematuria Father   . Kidney Stones Father   . Hematuria Brother   . Kidney Stones Brother   . Kidney disease Neg Hx     Social History Social History   Tobacco Use  . Smoking status: Never Smoker  . Smokeless tobacco: Never Used  Substance Use Topics  . Alcohol use: No  . Drug use: No    Review of Systems Constitutional: No fever/chills Eyes: No visual changes. ENT: No sore throat. Cardiovascular: Denies chest pain. Positive for palpitations.  Respiratory: Denies shortness of breath. Gastrointestinal: No abdominal pain.  No nausea, no vomiting.  No diarrhea.   Genitourinary: Negative for dysuria. Musculoskeletal: Negative  for back pain. Skin: Negative for rash. Neurological: Negative for headaches, focal weakness or numbness.  ____________________________________________   PHYSICAL EXAM:  VITAL SIGNS: ED Triage Vitals  Enc Vitals Group     BP 11/07/18 1740 (!) 184/132     Pulse Rate 11/07/18 1740 (!) 128     Resp 11/07/18 1740 18     Temp 11/07/18 1740 98.6 F (37 C)     Temp Source 11/07/18 1740 Oral     SpO2 11/07/18 1740 99 %     Weight 11/07/18 1741 225 lb (102.1 kg)      Height 11/07/18 1741 6\' 2"  (1.88 m)     Head Circumference --      Peak Flow --      Pain Score 11/07/18 1740 0    Constitutional: Alert and oriented.  Eyes: Conjunctivae are normal.  ENT      Head: Normocephalic and atraumatic.      Nose: No congestion/rhinnorhea.      Mouth/Throat: Mucous membranes are moist.      Neck: No stridor. Hematological/Lymphatic/Immunilogical: No cervical lymphadenopathy. Cardiovascular: Tachycardic, regular rhythm.  No murmurs, rubs, or gallops.  Respiratory: Normal respiratory effort without tachypnea nor retractions. Breath sounds are clear and equal bilaterally. No wheezes/rales/rhonchi. Gastrointestinal: Soft and non tender. No rebound. No guarding.  Genitourinary: Deferred Musculoskeletal: Normal range of motion in all extremities. No lower extremity edema. Neurologic:  Normal speech and language. No gross focal neurologic deficits are appreciated.  Skin:  Skin is warm, dry and intact. No rash noted. Psychiatric: Mood and affect are normal. Speech and behavior are normal. Patient exhibits appropriate insight and judgment.  ____________________________________________    LABS (pertinent positives/negatives)  CBC wbc 7.5, hgb 16.0, plt 197  ____________________________________________   EKG  I, Nance Pear, attending physician, personally viewed and interpreted this EKG  EKG Time: 1736 Rate: 129 Rhythm: aflutter Axis: normal Intervals: qtc 410 QRS: narrow, q waves v1 ST changes: no st elevation Impression: abnormal ekg  I, Nance Pear, attending physician, personally viewed and interpreted this EKG  EKG Time: 1746 Rate: 125 Rhythm: atrial flutter Axis: normal Intervals: qtc 460 QRS: narrow, q waves v1 ST changes: no st elevation Impression: abnormal ekg  I, Nance Pear, attending physician, personally viewed and interpreted this EKG  EKG Time: 1807 Rate: 66 Rhythm: atrial flutter Axis: normal Intervals: qtc  587 QRS: narrow ST changes: no st elevation Impression: abnormal ekg  ____________________________________________    RADIOLOGY  None  ____________________________________________   PROCEDURES  Procedures  ____________________________________________   INITIAL IMPRESSION / ASSESSMENT AND PLAN / ED COURSE  Pertinent labs & imaging results that were available during my care of the patient were reviewed by me and considered in my medical decision making (see chart for details).   Patient presented to the emergency department today because of concerns for fast heart rate and feeling unwell.  Initial EKG was concerning for atrial flutter with 2-1 conduction.  Patient was given diltiazem which did slow the ventricular rate and did expose underlying atrial flutter rhythm.  Blood work without any concerning electrolyte abnormalities.  Troponin minimally elevated.  The patient did feel better after his heart rate slowed.  I discussed with Dr. Ubaldo Glassing with cardiology.  This time will start patient on diltiazem orally and aspirin.  Patient will follow up with cardiology on Monday.  Discussed plan with patient.  Discussed return precautions.   ____________________________________________   FINAL CLINICAL IMPRESSION(S) / ED DIAGNOSES  Final diagnoses:  Atrial flutter,  unspecified type [facility])     Note: This dictation was prepared with Dragon dictation. Any transcriptional errors that result from this process are unintentional     Nance Pear, MD 11/07/18 1845

## 2018-11-07 NOTE — ED Triage Notes (Signed)
Patient presents to the ED with new high blood pressure and high pulse.  Patient states he felt, "weird" since he woke up this am.  Patient states he feels, "fluttering" in his chest.  Patient states he feels slightly light headed.

## 2018-11-25 DIAGNOSIS — I4892 Unspecified atrial flutter: Secondary | ICD-10-CM | POA: Insufficient documentation

## 2018-12-01 ENCOUNTER — Other Ambulatory Visit
Admission: RE | Admit: 2018-12-01 | Discharge: 2018-12-01 | Disposition: A | Discharge status: Home / Self Care | Payer: BC Managed Care – PPO | Source: Ambulatory Visit | Attending: Cardiology | Admitting: Cardiology

## 2018-12-01 ENCOUNTER — Other Ambulatory Visit: Payer: Self-pay

## 2018-12-01 DIAGNOSIS — Z01812 Encounter for preprocedural laboratory examination: Secondary | ICD-10-CM | POA: Diagnosis not present

## 2018-12-01 DIAGNOSIS — Z20828 Contact with and (suspected) exposure to other viral communicable diseases: Secondary | ICD-10-CM | POA: Diagnosis not present

## 2018-12-01 LAB — SARS CORONAVIRUS 2 (TAT 6-24 HRS): SARS Coronavirus 2: NEGATIVE

## 2018-12-04 ENCOUNTER — Ambulatory Visit: Payer: BC Managed Care – PPO | Admitting: Anesthesiology

## 2018-12-04 ENCOUNTER — Encounter
Admission: RE | Disposition: A | Discharge status: Home / Self Care | Payer: Self-pay | Source: Home / Self Care | Attending: Cardiology

## 2018-12-04 ENCOUNTER — Ambulatory Visit
Admission: RE | Admit: 2018-12-04 | Discharge: 2018-12-04 | Disposition: A | Discharge status: Home / Self Care | Payer: BC Managed Care – PPO | Attending: Cardiology | Admitting: Cardiology

## 2018-12-04 ENCOUNTER — Other Ambulatory Visit: Payer: Self-pay

## 2018-12-04 DIAGNOSIS — M109 Gout, unspecified: Secondary | ICD-10-CM | POA: Insufficient documentation

## 2018-12-04 DIAGNOSIS — I4891 Unspecified atrial fibrillation: Secondary | ICD-10-CM | POA: Diagnosis not present

## 2018-12-04 DIAGNOSIS — Z6828 Body mass index (BMI) 28.0-28.9, adult: Secondary | ICD-10-CM | POA: Diagnosis not present

## 2018-12-04 DIAGNOSIS — Z87442 Personal history of urinary calculi: Secondary | ICD-10-CM | POA: Insufficient documentation

## 2018-12-04 DIAGNOSIS — I10 Essential (primary) hypertension: Secondary | ICD-10-CM | POA: Diagnosis not present

## 2018-12-04 HISTORY — PX: CARDIOVERSION: SHX1299

## 2018-12-04 HISTORY — DX: Gout, unspecified: M10.9

## 2018-12-04 SURGERY — CARDIOVERSION
Anesthesia: General

## 2018-12-04 MED ORDER — PROPOFOL 500 MG/50ML IV EMUL
INTRAVENOUS | Status: AC
  Filled 2018-12-04: qty 50

## 2018-12-04 MED ORDER — ONDANSETRON HCL 4 MG/2ML IJ SOLN: 4.0000 mg | Freq: Once | INTRAMUSCULAR | Status: DC | PRN

## 2018-12-04 MED ORDER — SODIUM CHLORIDE 0.9 % IV SOLN
INTRAVENOUS | Status: DC
  Administered 2018-12-04: 07:00:00 via INTRAVENOUS

## 2018-12-04 MED ORDER — PROPOFOL 10 MG/ML IV BOLUS
INTRAVENOUS | Status: DC | PRN
  Administered 2018-12-04: 80 mg via INTRAVENOUS

## 2018-12-04 MED ORDER — FENTANYL CITRATE (PF) 100 MCG/2ML IJ SOLN: 25.0000 ug | INTRAMUSCULAR | Status: DC | PRN

## 2018-12-04 NOTE — Anesthesia Post-op Follow-up Note (Signed)
Anesthesia QCDR form completed.        

## 2018-12-04 NOTE — Anesthesia Preprocedure Evaluation (Signed)
Anesthesia Evaluation  Patient identified by MRN, date of birth, ID band Patient awake    Reviewed: Allergy & Precautions, NPO status , Patient's Chart, lab work & pertinent test results  Airway Mallampati: II       Dental  (+) Teeth Intact   Pulmonary neg pulmonary ROS,    breath sounds clear to auscultation       Cardiovascular hypertension, + dysrhythmias Atrial Fibrillation  Rhythm:Regular     Neuro/Psych negative neurological ROS  negative psych ROS   GI/Hepatic negative GI ROS, Neg liver ROS,   Endo/Other  Morbid obesity  Renal/GU negative Renal ROSstones     Musculoskeletal negative musculoskeletal ROS (+)   Abdominal (+) + obese,   Peds  Hematology   Anesthesia Other Findings Past Medical History: 12/04/2018: Gout No date: Hypertension     Comment:  borderline-pt states it is higher in md office but home               checks are 140's/70's-PT CONTROLS BP WITH DIET AND               EXERCISE PER PT No date: Kidney stones  Reproductive/Obstetrics                             Anesthesia Physical  Anesthesia Plan  ASA: III  Anesthesia Plan: General   Post-op Pain Management:    Induction: Intravenous  PONV Risk Score and Plan:   Airway Management Planned: Nasal Cannula  Additional Equipment:   Intra-op Plan:   Post-operative Plan: Extubation in OR  Informed Consent: I have reviewed the patients History and Physical, chart, labs and discussed the procedure including the risks, benefits and alternatives for the proposed anesthesia with the patient or authorized representative who has indicated his/her understanding and acceptance.       Plan Discussed with: CRNA  Anesthesia Plan Comments:         Anesthesia Quick Evaluation

## 2018-12-04 NOTE — Transfer of Care (Signed)
Immediate Anesthesia Transfer of Care Note  Patient: Juan Dunlap  Procedure(s) Performed: CARDIOVERSION (N/A )  Patient Location: PACU  Anesthesia Type:General  Level of Consciousness: drowsy and patient cooperative  Airway & Oxygen Therapy: Patient Spontanous Breathing and Patient connected to nasal cannula oxygen  Post-op Assessment: Report given to RN and Post -op Vital signs reviewed and stable  Post vital signs: Reviewed and stable  Last Vitals:  Vitals Value Taken Time  BP 126/84 12/04/18 0736  Temp    Pulse 68 12/04/18 0736  Resp 18 12/04/18 0736  SpO2 99 % 12/04/18 0736  Vitals shown include unvalidated device data.  Last Pain:  Vitals:   12/04/18 0717  TempSrc: Oral  PainSc: 0-No pain         Complications: No apparent anesthesia complications

## 2018-12-04 NOTE — Op Note (Signed)
[facility]   12/04/2018                     7:32 AM  PATIENT:  [name]    PRE-OPERATIVE DIAGNOSIS:  Cardioversion   Afib  POST-OPERATIVE DIAGNOSIS:  Same  PROCEDURE:  CARDIOVERSION  SURGEON:  Isaias Cowman, MD    ANESTHESIA:     PREOPERATIVE INDICATIONS:  Juan Dunlap is a  63 y.o. male with a diagnosis of Cardioversion   Afib who failed conservative measures and elected for surgical management.    The risks benefits and alternatives were discussed with the patient preoperatively including but not limited to the risks of infection, bleeding, cardiopulmonary complications, the need for revision surgery, among others, and the patient was willing to proceed.   OPERATIVE PROCEDURE: The patient presented to special procedures area in a fasting state.  Telemetry revealed atrial flutter at a rate of 110 bpm.  Patient received 80 mg of propofol.  He underwent successful cardioversion with 75 J to sinus rhythm.  There were no periprocedural complications.

## 2018-12-04 NOTE — Anesthesia Postprocedure Evaluation (Signed)
Anesthesia Post Note  Patient: Juan Dunlap  Procedure(s) Performed: CARDIOVERSION (N/A )  Patient location during evaluation: Specials Recovery Anesthesia Type: General Level of consciousness: awake and alert and oriented Pain management: pain level controlled Vital Signs Assessment: post-procedure vital signs reviewed and stable Respiratory status: spontaneous breathing Cardiovascular status: blood pressure returned to baseline Anesthetic complications: no     Last Vitals:  Vitals:   12/04/18 0800 12/04/18 0815  BP: 110/79 132/79  Pulse: (!) 50 (!) 57  Resp: 11 10  Temp:    SpO2: 94% 95%    Last Pain:  Vitals:   12/04/18 0815  TempSrc:   PainSc: 0-No pain                 Annalysse Shoemaker

## 2018-12-05 ENCOUNTER — Encounter: Payer: Self-pay | Admitting: Cardiology

## 2018-12-10 DIAGNOSIS — Z9289 Personal history of other medical treatment: Secondary | ICD-10-CM | POA: Insufficient documentation

## 2019-06-10 ENCOUNTER — Other Ambulatory Visit: Payer: Self-pay

## 2019-06-10 ENCOUNTER — Ambulatory Visit: Payer: BC Managed Care – PPO | Admitting: Dermatology

## 2019-06-10 DIAGNOSIS — L570 Actinic keratosis: Secondary | ICD-10-CM | POA: Diagnosis not present

## 2019-06-10 DIAGNOSIS — L820 Inflamed seborrheic keratosis: Secondary | ICD-10-CM | POA: Diagnosis not present

## 2019-06-10 DIAGNOSIS — Q828 Other specified congenital malformations of skin: Secondary | ICD-10-CM

## 2019-06-10 NOTE — Patient Instructions (Signed)

## 2019-06-10 NOTE — Progress Notes (Signed)
   Follow-Up Visit   Subjective  Juan Dunlap is a 64 y.o. male who presents for the following: Other (Precancer spot that was frozen above left brow has come back - was gone for about a year.).     The following portions of the chart were reviewed this encounter and updated as appropriate:  Tobacco  Allergies  Meds  Problems  Med Hx  Surg Hx  Fam Hx      Review of Systems:  No other skin or systemic complaints except as noted in HPI or Assessment and Plan.  Objective  Well appearing patient in no apparent distress; mood and affect are within normal limits.  A focused examination was performed including face. Relevant physical exam findings are noted in the Assessment and Plan.  Objective  Left Eyebrow: Erythematous thin papules/macules with gritty scale.   Objective  Right inf cheek: Erythematous keratotic or waxy stuck-on papule or plaque.   Objective  Left Forearm - Posterior: 2.5 x 2.0 cm anular scaly patch.  Images       Assessment & Plan  AK (actinic keratosis) Left Eyebrow  Destruction of lesion - Left Eyebrow Complexity: simple   Destruction method: cryotherapy   Informed consent: discussed and consent obtained   Timeout:  patient name, date of birth, surgical site, and procedure verified Lesion destroyed using liquid nitrogen: Yes   Region frozen until ice ball extended beyond lesion: Yes   Outcome: patient tolerated procedure well with no complications   Post-procedure details: wound care instructions given    Inflamed seborrheic keratosis Right inf cheek  Destruction of lesion - Right inf cheek Complexity: simple   Destruction method: cryotherapy   Informed consent: discussed and consent obtained   Timeout:  patient name, date of birth, surgical site, and procedure verified Lesion destroyed using liquid nitrogen: Yes   Region frozen until ice ball extended beyond lesion: Yes   Outcome: patient tolerated procedure well with no  complications   Post-procedure details: wound care instructions given    Porokeratosis Left Forearm - Posterior  Return in about 3 months (around 09/10/2019).   I, Ashok Cordia, CMA, am acting as scribe for Sarina Ser, MD .  Documentation: I have reviewed the above documentation for accuracy and completeness, and I agree with the above.  Sarina Ser, MD

## 2019-06-15 ENCOUNTER — Encounter: Payer: Self-pay | Admitting: Dermatology

## 2019-09-23 ENCOUNTER — Ambulatory Visit: Payer: BC Managed Care – PPO | Admitting: Dermatology

## 2019-09-23 ENCOUNTER — Other Ambulatory Visit: Payer: Self-pay

## 2019-09-23 DIAGNOSIS — L820 Inflamed seborrheic keratosis: Secondary | ICD-10-CM | POA: Diagnosis not present

## 2019-09-23 DIAGNOSIS — L578 Other skin changes due to chronic exposure to nonionizing radiation: Secondary | ICD-10-CM

## 2019-09-23 DIAGNOSIS — Z872 Personal history of diseases of the skin and subcutaneous tissue: Secondary | ICD-10-CM

## 2019-09-23 NOTE — Progress Notes (Signed)
   Follow-Up Visit   Subjective  Juan Dunlap is a 64 y.o. male who presents for the following: Actinic Keratosis (L eyebrow 18m f/u LN2 and skin medicinals 20fu/calcipotriene cream 2wks in June) and ISK f/u (R inf cheek, 64m f/u, LN2).  The following portions of the chart were reviewed this encounter and updated as appropriate:  Tobacco  Allergies  Meds  Problems  Med Hx  Surg Hx  Fam Hx     Review of Systems:  No other skin or systemic complaints except as noted in HPI or Assessment and Plan.  Objective  Well appearing patient in no apparent distress; mood and affect are within normal limits.  A focused examination was performed including face. Relevant physical exam findings are noted in the Assessment and Plan.  Objective  Left eyebrow L brow area clear  Objective  R inf cheek x 1: Residual erythematous keratotic pap   Assessment & Plan  History of actinic keratoses Left eyebrow Clear with 2 Ln2 treatments and 5FU/Calcipotriene cr x 2wks  Inflamed seborrheic keratosis R inf cheek x 1 Ln2 today In 3 weeks start 5FU/Calcipotriene cream bid x 1 wk   Destruction of lesion - R inf cheek x 1 Complexity: simple   Destruction method: cryotherapy   Informed consent: discussed and consent obtained   Timeout:  patient name, date of birth, surgical site, and procedure verified Lesion destroyed using liquid nitrogen: Yes   Region frozen until ice ball extended beyond lesion: Yes   Outcome: patient tolerated procedure well with no complications   Post-procedure details: wound care instructions given    Actinic Damage - diffuse scaly erythematous macules with underlying dyspigmentation - Recommend daily broad spectrum sunscreen SPF 30+ to sun-exposed areas, reapply every 2 hours as needed.  - Call for new or changing lesions.  Return in about 1 year (around 09/22/2020) for AK/ISK.  I, Othelia Pulling, RMA, am acting as scribe for Sarina Ser, MD .  Documentation: I have  reviewed the above documentation for accuracy and completeness, and I agree with the above.  Sarina Ser, MD

## 2019-09-28 ENCOUNTER — Encounter: Payer: Self-pay | Admitting: Dermatology

## 2019-10-21 ENCOUNTER — Other Ambulatory Visit: Payer: Self-pay

## 2019-10-21 ENCOUNTER — Other Ambulatory Visit
Admission: RE | Admit: 2019-10-21 | Discharge: 2019-10-21 | Disposition: A | Discharge status: Home / Self Care | Payer: BC Managed Care – PPO | Source: Ambulatory Visit | Attending: Gastroenterology | Admitting: Gastroenterology

## 2019-10-21 DIAGNOSIS — Z20822 Contact with and (suspected) exposure to covid-19: Secondary | ICD-10-CM | POA: Diagnosis not present

## 2019-10-21 DIAGNOSIS — Z01812 Encounter for preprocedural laboratory examination: Secondary | ICD-10-CM | POA: Diagnosis not present

## 2019-10-21 LAB — SARS CORONAVIRUS 2 (TAT 6-24 HRS): SARS Coronavirus 2: NEGATIVE

## 2019-10-22 ENCOUNTER — Encounter: Payer: Self-pay | Admitting: *Deleted

## 2019-10-23 ENCOUNTER — Ambulatory Visit: Payer: BC Managed Care – PPO | Admitting: Certified Registered Nurse Anesthetist

## 2019-10-23 ENCOUNTER — Encounter
Admission: RE | Disposition: A | Discharge status: Home / Self Care | Payer: Self-pay | Source: Home / Self Care | Attending: Gastroenterology

## 2019-10-23 ENCOUNTER — Other Ambulatory Visit: Payer: Self-pay

## 2019-10-23 ENCOUNTER — Ambulatory Visit
Admission: RE | Admit: 2019-10-23 | Discharge: 2019-10-23 | Disposition: A | Discharge status: Home / Self Care | Payer: BC Managed Care – PPO | Attending: Gastroenterology | Admitting: Gastroenterology

## 2019-10-23 ENCOUNTER — Encounter: Payer: Self-pay | Admitting: *Deleted

## 2019-10-23 DIAGNOSIS — Z79899 Other long term (current) drug therapy: Secondary | ICD-10-CM | POA: Insufficient documentation

## 2019-10-23 DIAGNOSIS — Z800 Family history of malignant neoplasm of digestive organs: Secondary | ICD-10-CM | POA: Diagnosis present

## 2019-10-23 DIAGNOSIS — Z7901 Long term (current) use of anticoagulants: Secondary | ICD-10-CM | POA: Diagnosis not present

## 2019-10-23 DIAGNOSIS — I10 Essential (primary) hypertension: Secondary | ICD-10-CM | POA: Insufficient documentation

## 2019-10-23 DIAGNOSIS — Z1211 Encounter for screening for malignant neoplasm of colon: Secondary | ICD-10-CM | POA: Diagnosis not present

## 2019-10-23 DIAGNOSIS — K5730 Diverticulosis of large intestine without perforation or abscess without bleeding: Secondary | ICD-10-CM | POA: Diagnosis not present

## 2019-10-23 DIAGNOSIS — Z6829 Body mass index (BMI) 29.0-29.9, adult: Secondary | ICD-10-CM | POA: Insufficient documentation

## 2019-10-23 DIAGNOSIS — I4891 Unspecified atrial fibrillation: Secondary | ICD-10-CM | POA: Insufficient documentation

## 2019-10-23 DIAGNOSIS — K640 First degree hemorrhoids: Secondary | ICD-10-CM | POA: Insufficient documentation

## 2019-10-23 HISTORY — DX: Hyperlipidemia, unspecified: E78.5

## 2019-10-23 HISTORY — DX: Unspecified atrial flutter: I48.92

## 2019-10-23 HISTORY — PX: COLONOSCOPY WITH PROPOFOL: SHX5780

## 2019-10-23 SURGERY — COLONOSCOPY WITH PROPOFOL
Anesthesia: General

## 2019-10-23 MED ORDER — GLYCOPYRROLATE 0.2 MG/ML IJ SOLN
INTRAMUSCULAR | Status: DC | PRN
  Administered 2019-10-23: .2 mg via INTRAVENOUS

## 2019-10-23 MED ORDER — PROPOFOL 10 MG/ML IV BOLUS
INTRAVENOUS | Status: DC | PRN
  Administered 2019-10-23: 10 mg via INTRAVENOUS
  Administered 2019-10-23: 20 mg via INTRAVENOUS
  Administered 2019-10-23: 50 mg via INTRAVENOUS

## 2019-10-23 MED ORDER — PROPOFOL 500 MG/50ML IV EMUL
INTRAVENOUS | Status: DC | PRN
  Administered 2019-10-23: 125 ug/kg/min via INTRAVENOUS

## 2019-10-23 MED ORDER — EPHEDRINE SULFATE 50 MG/ML IJ SOLN
INTRAMUSCULAR | Status: DC | PRN
  Administered 2019-10-23 (×2): 10 mg via INTRAVENOUS

## 2019-10-23 MED ORDER — PROPOFOL 500 MG/50ML IV EMUL
INTRAVENOUS | Status: AC
  Filled 2019-10-23: qty 50

## 2019-10-23 MED ORDER — PROPOFOL 10 MG/ML IV BOLUS
INTRAVENOUS | Status: AC
  Filled 2019-10-23: qty 20

## 2019-10-23 MED ORDER — SODIUM CHLORIDE 0.9 % IV SOLN: INTRAVENOUS | Status: DC

## 2019-10-23 MED ORDER — LIDOCAINE HCL (CARDIAC) PF 100 MG/5ML IV SOSY
PREFILLED_SYRINGE | INTRAVENOUS | Status: DC | PRN
  Administered 2019-10-23: 60 mg via INTRAVENOUS

## 2019-10-23 NOTE — Op Note (Signed)
[facility] Gastroenterology Patient Name: Juan Dunlap Procedure Date: 10/23/2019 2:39 PM MRN: [id number] Account #: [id number] Date of Birth: October 11, 1955 Admit Type: Outpatient Age: 64 Room: [facility] ENDO ROOM 3 Gender: Male Note Status: Finalized Procedure:             Colonoscopy Indications:           Screening in patient at increased risk: Family history                         of 1st-degree relative with colorectal cancer Providers:             Andrey Farmer MD, MD Referring MD:          Irven Easterly. Kary Kos, MD (Referring MD) Medicines:             Monitored Anesthesia Care Complications:         No immediate complications. Procedure:             Pre-Anesthesia Assessment:                        - Prior to the procedure, a History and Physical was                         performed, and patient medications and allergies were                         reviewed. The patient is competent. The risks and                         benefits of the procedure and the sedation options and                         risks were discussed with the patient. All questions                         were answered and informed consent was obtained.                         Patient identification and proposed procedure were                         verified by the physician, the nurse, the anesthetist                         and the technician in the endoscopy suite. Mental                         Status Examination: alert and oriented. Airway                         Examination: normal oropharyngeal airway and neck                         mobility. Respiratory Examination: clear to                         auscultation. CV Examination: normal. Prophylactic  Antibiotics: The patient does not require prophylactic                         antibiotics. Prior Anticoagulants: The patient has                         taken no previous anticoagulant or antiplatelet                          agents. ASA Grade Assessment: II - A patient with mild                         systemic disease. After reviewing the risks and                         benefits, the patient was deemed in satisfactory                         condition to undergo the procedure. The anesthesia                         plan was to use monitored anesthesia care (MAC).                         Immediately prior to administration of medications,                         the patient was re-assessed for adequacy to receive                         sedatives. The heart rate, respiratory rate, oxygen                         saturations, blood pressure, adequacy of pulmonary                         ventilation, and response to care were monitored                         throughout the procedure. The physical status of the                         patient was re-assessed after the procedure.                        After obtaining informed consent, the colonoscope was                         passed under direct vision. Throughout the procedure,                         the patient's blood pressure, pulse, and oxygen                         saturations were monitored continuously. The                         Colonoscope was introduced through the anus and  advanced to the the cecum, identified by appendiceal                         orifice and ileocecal valve. The colonoscopy was                         performed without difficulty. The patient tolerated                         the procedure well. The quality of the bowel                         preparation was good. Findings:      The perianal and digital rectal examinations were normal.      A single small-mouthed diverticulum was found in the sigmoid colon.      Non-bleeding internal hemorrhoids were found during retroflexion. The       hemorrhoids were Grade I (internal hemorrhoids that do not prolapse).      The exam was otherwise without  abnormality on direct and retroflexion       views. Impression:            - Diverticulosis in the sigmoid colon.                        - Non-bleeding internal hemorrhoids.                        - The examination was otherwise normal on direct and                         retroflexion views.                        - No specimens collected. Recommendation:        - Discharge patient to home.                        - Resume previous diet.                        - Continue present medications.                        - Repeat colonoscopy in 5 years for screening purposes.                        - Return to referring physician as previously                         scheduled. Procedure Code(s):     --- Professional ---                        U7253, Colorectal cancer screening; colonoscopy on                         individual at high risk Diagnosis Code(s):     --- Professional ---                        Z80.0, Family history of malignant neoplasm of  digestive organs                        K64.0, First degree hemorrhoids                        K57.30, Diverticulosis of large intestine without                         perforation or abscess without bleeding CPT copyright 2019 American Medical Association. All rights reserved. The codes documented in this report are preliminary and upon coder review may  be revised to meet current compliance requirements. Andrey Farmer, MD Andrey Farmer MD, MD 10/23/2019 3:08:56 PM Number of Addenda: 0 Note Initiated On: 10/23/2019 2:39 PM Scope Withdrawal Time: 0 hours 9 minutes 11 seconds  Total Procedure Duration: 0 hours 13 minutes 46 seconds  Estimated Blood Loss:  Estimated blood loss: none.      [facility]

## 2019-10-23 NOTE — Anesthesia Preprocedure Evaluation (Signed)
Anesthesia Evaluation  Patient identified by MRN, date of birth, ID band Patient awake    Reviewed: Allergy & Precautions, NPO status , Patient's Chart, lab work & pertinent test results  History of Anesthesia Complications Negative for: history of anesthetic complications  Airway Mallampati: II       Dental  (+) Teeth Intact, Dental Advidsory Given, Caps   Pulmonary neg pulmonary ROS,    breath sounds clear to auscultation       Cardiovascular Exercise Tolerance: Good hypertension, (-) angina(-) Past MI and (-) Cardiac Stents + dysrhythmias Atrial Fibrillation (-) Valvular Problems/Murmurs Rhythm:Regular     Neuro/Psych negative neurological ROS  negative psych ROS   GI/Hepatic negative GI ROS, Neg liver ROS,   Endo/Other  neg diabetesMorbid obesity  Renal/GU Renal disease (kidney stones)stones     Musculoskeletal negative musculoskeletal ROS (+)   Abdominal (+) + obese,   Peds  Hematology   Anesthesia Other Findings Past Medical History: 12/04/2018: Gout No date: Hypertension     Comment:  borderline-pt states it is higher in md office but home               checks are 140's/70's-PT CONTROLS BP WITH DIET AND               EXERCISE PER PT No date: Kidney stones  Reproductive/Obstetrics                             Anesthesia Physical  Anesthesia Plan  ASA: III  Anesthesia Plan: General   Post-op Pain Management:    Induction: Intravenous  PONV Risk Score and Plan: 2  Airway Management Planned: Nasal Cannula and Natural Airway  Additional Equipment:   Intra-op Plan:   Post-operative Plan:   Informed Consent: I have reviewed the patients History and Physical, chart, labs and discussed the procedure including the risks, benefits and alternatives for the proposed anesthesia with the patient or authorized representative who has indicated his/her understanding and acceptance.        Plan Discussed with: CRNA  Anesthesia Plan Comments:         Anesthesia Quick Evaluation

## 2019-10-23 NOTE — H&P (Signed)
Outpatient short stay form Pre-procedure 10/23/2019 2:42 PM Juan Miyamoto MD, MPH  Primary Physician: Dr. Kary Kos  Reason for visit:  Screening  History of present illness:   64 y/o gentleman with family history of colon cancer in Father at age of 7. Patient had normal colonoscopy 13 years ago. Takes eliquis for a. Fib and last dose was > 2 days ago.    Current Facility-Administered Medications:  .  0.9 %  sodium chloride infusion, , Intravenous, Continuous, Essa Juan Dunlap, Hilton Cork, MD, Last Rate: 20 mL/hr at 10/23/19 1408, Continued from Pre-op at 10/23/19 1408  Facility-Administered Medications Prior to Admission  Medication Dose Route Frequency Provider Last Rate Last Admin  . betamethasone acetate-betamethasone sodium phosphate (CELESTONE) injection 3 mg  3 mg Intramuscular Once Juan Dunlap, DPM       Medications Prior to Admission  Medication Sig Dispense Refill Last Dose  . diltiazem (CARDIZEM CD) 180 MG 24 hr capsule Take 180 mg by mouth daily.   10/23/2019 at Unknown time  . Milk Thistle 140 MG CAPS Take 140 mg by mouth daily.    Past Week at Unknown time  . Multiple Vitamin (MULTI-VITAMINS) TABS Take 1 capsule by mouth daily.    Past Week at Unknown time  . Multiple Vitamins-Minerals (EYE VITAMINS & MINERALS PO) Take 1 tablet by mouth daily.   Past Week at Unknown time  . Turmeric Curcumin 500 MG CAPS Take 500 mg by mouth daily.   Past Week at Unknown time  . apixaban (ELIQUIS) 5 MG TABS tablet Take 5 mg by mouth 2 (two) times daily.   10/19/2019  . Cholecalciferol (VITAMIN D3) 2000 units capsule Take 2,000 Units by mouth daily.      . flecainide (TAMBOCOR) 50 MG tablet Take 50 mg by mouth 2 (two) times daily.        No Known Allergies   Past Medical History:  Diagnosis Date  . Atrial flutter ([place])   . Gout 12/04/2018  . Hyperlipidemia   . Hypertension    borderline-pt states it is higher in md office but home checks are 140's/70's-PT CONTROLS BP WITH DIET AND  EXERCISE PER PT  . Kidney stones     Review of systems:  Otherwise negative.    Physical Exam  Gen: Alert, oriented. Appears stated age.  HEENT: [place]/AT. PERRLA. Lungs: No respiratory distress Abd: soft, benign, no masses.  Ext: No edema.     Planned procedures: Proceed with colonoscopy. The patient understands the nature of the planned procedure, indications, risks, alternatives and potential complications including but not limited to bleeding, infection, perforation, damage to internal organs and possible oversedation/side effects from anesthesia. The patient agrees and gives consent to proceed.  Please refer to procedure notes for findings, recommendations and patient disposition/instructions.     Juan Miyamoto MD, MPH Gastroenterology 10/23/2019  2:42 PM

## 2019-10-23 NOTE — Anesthesia Procedure Notes (Signed)
Date/Time: 10/23/2019 2:50 PM Performed by: Allean Found, CRNA Pre-anesthesia Checklist: Patient identified, Emergency Drugs available, Suction available, Patient being monitored and Timeout performed Patient Re-evaluated:Patient Re-evaluated prior to induction Oxygen Delivery Method: Nasal cannula

## 2019-10-23 NOTE — Interval H&P Note (Signed)
History and Physical Interval Note:  10/23/2019 2:44 PM  Juan Dunlap  has presented today for surgery, with the diagnosis of FAM HX COLON CA.  The various methods of treatment have been discussed with the patient and family. After consideration of risks, benefits and other options for treatment, the patient has consented to  Procedure(s): COLONOSCOPY WITH PROPOFOL (N/A) as a surgical intervention.  The patient's history has been reviewed, patient examined, no change in status, stable for surgery.  I have reviewed the patient's chart and labs.  Questions were answered to the patient's satisfaction.     Lesly Rubenstein  Ok to proceed with colonoscopy.

## 2019-10-23 NOTE — Transfer of Care (Signed)
Immediate Anesthesia Transfer of Care Note  Patient: Juan Dunlap  Procedure(s) Performed: COLONOSCOPY WITH PROPOFOL (N/A )  Patient Location: PACU  Anesthesia Type:General  Level of Consciousness: awake, alert  and oriented  Airway & Oxygen Therapy: Patient Spontanous Breathing  Post-op Assessment: Report given to RN and Post -op Vital signs reviewed and stable  Post vital signs: Reviewed and stable  Last Vitals:  Vitals Value Taken Time  BP 116/68 10/23/19 1514  Temp    Pulse 60 10/23/19 1516  Resp 17 10/23/19 1516  SpO2 100 % 10/23/19 1516  Vitals shown include unvalidated device data.  Last Pain:  Vitals:   10/23/19 1345  TempSrc: Temporal         Complications: No complications documented.

## 2019-10-26 ENCOUNTER — Encounter: Payer: Self-pay | Admitting: Gastroenterology

## 2019-10-26 NOTE — Anesthesia Postprocedure Evaluation (Signed)
Anesthesia Post Note  Patient: Juan Dunlap  Procedure(s) Performed: COLONOSCOPY WITH PROPOFOL (N/A )  Patient location during evaluation: Endoscopy Anesthesia Type: General Level of consciousness: awake and alert Pain management: pain level controlled Vital Signs Assessment: post-procedure vital signs reviewed and stable Respiratory status: spontaneous breathing, nonlabored ventilation, respiratory function stable and patient connected to nasal cannula oxygen Cardiovascular status: blood pressure returned to baseline and stable Postop Assessment: no apparent nausea or vomiting Anesthetic complications: no   No complications documented.   Last Vitals:  Vitals:   10/23/19 1345 10/23/19 1514  BP: (!) 149/78   Pulse: (!) 50   Temp: (!) 36.1 C (!) 36 C  SpO2: 98%     Last Pain:  Vitals:   10/24/19 1025  TempSrc:   PainSc: 0-No pain                 Martha Clan

## 2020-02-19 ENCOUNTER — Other Ambulatory Visit
Admission: RE | Admit: 2020-02-19 | Discharge: 2020-02-19 | Disposition: A | Discharge status: Home / Self Care | Payer: BC Managed Care – PPO | Source: Ambulatory Visit | Attending: Cardiology | Admitting: Cardiology

## 2020-02-19 ENCOUNTER — Other Ambulatory Visit: Payer: Self-pay

## 2020-02-19 DIAGNOSIS — Z20822 Contact with and (suspected) exposure to covid-19: Secondary | ICD-10-CM | POA: Diagnosis not present

## 2020-02-19 DIAGNOSIS — Z01812 Encounter for preprocedural laboratory examination: Secondary | ICD-10-CM | POA: Diagnosis present

## 2020-02-20 LAB — SARS CORONAVIRUS 2 (TAT 6-24 HRS): SARS Coronavirus 2: NEGATIVE

## 2020-02-23 ENCOUNTER — Ambulatory Visit: Payer: BC Managed Care – PPO | Admitting: Anesthesiology

## 2020-02-23 ENCOUNTER — Ambulatory Visit
Admission: RE | Admit: 2020-02-23 | Discharge: 2020-02-23 | Disposition: A | Discharge status: Home / Self Care | Payer: BC Managed Care – PPO | Attending: Cardiology | Admitting: Cardiology

## 2020-02-23 ENCOUNTER — Encounter
Admission: RE | Disposition: A | Discharge status: Home / Self Care | Payer: Self-pay | Source: Home / Self Care | Attending: Cardiology

## 2020-02-23 ENCOUNTER — Encounter: Payer: Self-pay | Admitting: Cardiology

## 2020-02-23 DIAGNOSIS — I4892 Unspecified atrial flutter: Secondary | ICD-10-CM | POA: Insufficient documentation

## 2020-02-23 DIAGNOSIS — I4891 Unspecified atrial fibrillation: Secondary | ICD-10-CM | POA: Insufficient documentation

## 2020-02-23 HISTORY — PX: CARDIOVERSION: SHX1299

## 2020-02-23 SURGERY — CARDIOVERSION
Anesthesia: General

## 2020-02-23 MED ORDER — PROPOFOL 10 MG/ML IV BOLUS
INTRAVENOUS | Status: DC | PRN
  Administered 2020-02-23: 60 mg via INTRAVENOUS
  Administered 2020-02-23: 20 mg via INTRAVENOUS

## 2020-02-23 MED ORDER — DILTIAZEM HCL ER COATED BEADS 120 MG PO CP24
120.0000 mg | ORAL_CAPSULE | Freq: Every day | ORAL | Status: AC
  Administered 2020-02-23: 120 mg via ORAL
  Filled 2020-02-23: qty 1

## 2020-02-23 MED ORDER — SODIUM CHLORIDE 0.9 % IV SOLN: INTRAVENOUS | Status: DC | PRN

## 2020-02-23 MED ORDER — FLECAINIDE ACETATE 50 MG PO TABS
50.0000 mg | ORAL_TABLET | Freq: Once | ORAL | Status: AC
  Administered 2020-02-23: 50 mg via ORAL
  Filled 2020-02-23: qty 1

## 2020-02-23 MED ORDER — APIXABAN 5 MG PO TABS
5.0000 mg | ORAL_TABLET | Freq: Two times a day (BID) | ORAL | Status: DC
  Administered 2020-02-23: 5 mg via ORAL
  Filled 2020-02-23: qty 1

## 2020-02-23 NOTE — Anesthesia Postprocedure Evaluation (Signed)
Anesthesia Post Note  Patient: Bronx Brogden  Procedure(s) Performed: CARDIOVERSION (N/A )  Patient location during evaluation: Specials Recovery Anesthesia Type: General Level of consciousness: awake and alert Pain management: pain level controlled Vital Signs Assessment: post-procedure vital signs reviewed and stable Respiratory status: spontaneous breathing, nonlabored ventilation, respiratory function stable and patient connected to nasal cannula oxygen Cardiovascular status: blood pressure returned to baseline and stable Postop Assessment: no apparent nausea or vomiting Anesthetic complications: no   No complications documented.   Last Vitals:  Vitals:   02/23/20 0945 02/23/20 1015  BP: (!) 131/94 (!) 154/99  Pulse: (!) 51   Resp: 20 20  Temp:    SpO2: 99%     Last Pain:  Vitals:   02/23/20 0840  TempSrc: Oral                 Precious Haws Arvie Villarruel

## 2020-02-23 NOTE — Op Note (Signed)
[facility] Cardiology   02/23/2020                     9:38 AM  PATIENT:  Juan Dunlap    PRE-OPERATIVE DIAGNOSIS:  Cardioversion   Afib flutter   Ok'd Dr Art Edwyna Ready  POST-OPERATIVE DIAGNOSIS:  Same  PROCEDURE:  CARDIOVERSION  SURGEON:  Isaias Cowman, MD    ANESTHESIA:     PREOPERATIVE INDICATIONS:  [name] is a  65 y.o. male with a diagnosis of Cardioversion   Afib flutter   Ok'd Dr Art Edwyna Ready who failed conservative measures and elected for surgical management.    The risks benefits and alternatives were discussed with the patient preoperatively including but not limited to the risks of infection, bleeding, cardiopulmonary complications, the need for revision surgery, among others, and the patient was willing to proceed.   OPERATIVE PROCEDURE: Patient brought to special procedures holding area in a fasting state.  Anesthesia was obtained with 80 mg per propofol.  The patient underwent cardioversion with 75 J, 120 J, and 200 J successfully to sinus rhythm at 58 bpm.  There were no periprocedural complications.

## 2020-02-23 NOTE — Transfer of Care (Signed)
Immediate Anesthesia Transfer of Care Note  Patient: Juan Dunlap  Procedure(s) Performed: CARDIOVERSION (N/A )  Patient Location: Short Stay  Anesthesia Type:General  Level of Consciousness: sedated  Airway & Oxygen Therapy: Patient connected to nasal cannula oxygen  Post-op Assessment: Post -op Vital signs reviewed and stable  Post vital signs: stable  Last Vitals:  Vitals Value Taken Time  BP    Temp    Pulse 42 02/23/20 0930  Resp    SpO2 99 % 02/23/20 0930  Vitals shown include unvalidated device data.  Last Pain:  Vitals:   02/23/20 0840  TempSrc: Oral         Complications: No complications documented.

## 2020-02-23 NOTE — Anesthesia Preprocedure Evaluation (Signed)
Anesthesia Evaluation  Patient identified by MRN, date of birth, ID band Patient awake    Reviewed: Allergy & Precautions, H&P , NPO status , Patient's Chart, lab work & pertinent test results  History of Anesthesia Complications (+) DIFFICULT AIRWAY and history of anesthetic complications  Airway Mallampati: III  TM Distance: <3 FB Neck ROM: full    Dental  (+) Chipped, Poor Dentition, Caps   Pulmonary neg pulmonary ROS, neg shortness of breath,    Pulmonary exam normal        Cardiovascular Exercise Tolerance: Good hypertension, + dysrhythmias Atrial Fibrillation  Rhythm:irregular Rate:Tachycardia     Neuro/Psych negative neurological ROS  negative psych ROS   GI/Hepatic negative GI ROS, Neg liver ROS,   Endo/Other  negative endocrine ROS  Renal/GU Renal disease     Musculoskeletal   Abdominal   Peds  Hematology negative hematology ROS (+)   Anesthesia Other Findings Past Medical History: No date: Atrial flutter ([place]) 12/04/2018: Gout No date: Hyperlipidemia No date: Hypertension     Comment:  borderline-pt states it is higher in md office but home               checks are 140's/70's-PT CONTROLS BP WITH DIET AND               EXERCISE PER PT No date: Kidney stones  Past Surgical History: 12/04/2018: CARDIOVERSION; N/A     Comment:  Procedure: CARDIOVERSION;  Surgeon: Isaias Cowman, MD;  Location: [facility] ORS;  Service:               Cardiovascular;  Laterality: N/A; 10/23/2019: COLONOSCOPY WITH PROPOFOL; N/A     Comment:  Procedure: COLONOSCOPY WITH PROPOFOL;  Surgeon:               Lesly Rubenstein, MD;  Location: [facility] ENDOSCOPY;                Service: Endoscopy;  Laterality: N/A; 09/07/2015: CYSTOSCOPY W/ RETROGRADES; Bilateral     Comment:  Procedure: CYSTOSCOPY WITH RETROGRADE PYELOGRAM;                Surgeon: Hollice Espy, MD;  Location: [facility] ORS;                Service:  Urology;  Laterality: Bilateral; 09/07/2015: CYSTOSCOPY WITH STENT PLACEMENT; Left     Comment:  Procedure: CYSTOSCOPY WITH STENT PLACEMENT;  Surgeon:               Hollice Espy, MD;  Location: [facility] ORS;  Service:               Urology;  Laterality: Left; 09/07/2015: URETEROSCOPY WITH HOLMIUM LASER LITHOTRIPSY; Left     Comment:  Procedure: URETEROSCOPY WITH HOLMIUM LASER LITHOTRIPSY;               Surgeon: Hollice Espy, MD;  Location: [facility] ORS;                Service: Urology;  Laterality: Left; No date: WISDOM TOOTH EXTRACTION     Reproductive/Obstetrics negative OB ROS                             Anesthesia Physical Anesthesia Plan  ASA: III  Anesthesia Plan: General   Post-op Pain Management:    Induction: Intravenous  PONV Risk Score and Plan: Ondansetron,  Dexamethasone, Midazolam and Treatment may vary due to age or medical condition  Airway Management Planned: Natural Airway and Nasal Cannula  Additional Equipment:   Intra-op Plan:   Post-operative Plan:   Informed Consent: I have reviewed the patients History and Physical, chart, labs and discussed the procedure including the risks, benefits and alternatives for the proposed anesthesia with the patient or authorized representative who has indicated his/her understanding and acceptance.     Dental Advisory Given  Plan Discussed with: Anesthesiologist, CRNA and Surgeon  Anesthesia Plan Comments: (Patient consented for risks of anesthesia including but not limited to:  - adverse reactions to medications - risk of airway placement if required - damage to eyes, teeth, lips or other oral mucosa - nerve damage due to positioning  - sore throat or hoarseness - Damage to heart, brain, nerves, lungs, other parts of body or loss of life  Patient voiced understanding.)        Anesthesia Quick Evaluation

## 2020-02-24 ENCOUNTER — Encounter: Payer: Self-pay | Admitting: Cardiology

## 2020-05-27 DIAGNOSIS — I480 Paroxysmal atrial fibrillation: Secondary | ICD-10-CM | POA: Insufficient documentation

## 2020-08-17 ENCOUNTER — Other Ambulatory Visit: Payer: Self-pay | Admitting: Student

## 2020-08-17 DIAGNOSIS — R599 Enlarged lymph nodes, unspecified: Secondary | ICD-10-CM

## 2020-08-23 ENCOUNTER — Ambulatory Visit: Payer: BC Managed Care – PPO

## 2020-08-24 ENCOUNTER — Other Ambulatory Visit: Payer: Self-pay

## 2020-08-24 ENCOUNTER — Ambulatory Visit
Admission: RE | Admit: 2020-08-24 | Discharge: 2020-08-24 | Disposition: A | Discharge status: Home / Self Care | Payer: BC Managed Care – PPO | Source: Ambulatory Visit | Attending: Student | Admitting: Student

## 2020-08-24 DIAGNOSIS — R599 Enlarged lymph nodes, unspecified: Secondary | ICD-10-CM | POA: Diagnosis not present

## 2020-09-22 ENCOUNTER — Ambulatory Visit: Payer: BC Managed Care – PPO | Admitting: Dermatology

## 2020-10-18 ENCOUNTER — Other Ambulatory Visit: Payer: Self-pay | Admitting: Sports Medicine

## 2020-10-18 DIAGNOSIS — M76891 Other specified enthesopathies of right lower limb, excluding foot: Secondary | ICD-10-CM

## 2020-10-18 DIAGNOSIS — M25561 Pain in right knee: Secondary | ICD-10-CM

## 2020-10-18 DIAGNOSIS — M25461 Effusion, right knee: Secondary | ICD-10-CM

## 2020-10-19 ENCOUNTER — Ambulatory Visit
Admission: RE | Admit: 2020-10-19 | Discharge: 2020-10-19 | Disposition: A | Discharge status: Home / Self Care | Payer: BC Managed Care – PPO | Source: Ambulatory Visit | Attending: Sports Medicine | Admitting: Sports Medicine

## 2020-10-19 ENCOUNTER — Other Ambulatory Visit: Payer: Self-pay

## 2020-10-19 DIAGNOSIS — M25461 Effusion, right knee: Secondary | ICD-10-CM | POA: Diagnosis present

## 2020-10-19 DIAGNOSIS — M76891 Other specified enthesopathies of right lower limb, excluding foot: Secondary | ICD-10-CM | POA: Insufficient documentation

## 2020-10-19 DIAGNOSIS — M25561 Pain in right knee: Secondary | ICD-10-CM | POA: Diagnosis present

## 2020-10-20 ENCOUNTER — Ambulatory Visit: Admission: RE | Admit: 2020-10-20 | Payer: BC Managed Care – PPO | Source: Ambulatory Visit

## 2020-11-08 DIAGNOSIS — E785 Hyperlipidemia, unspecified: Secondary | ICD-10-CM | POA: Insufficient documentation

## 2021-01-03 ENCOUNTER — Other Ambulatory Visit: Payer: Self-pay

## 2021-01-03 ENCOUNTER — Encounter: Payer: Self-pay | Admitting: Dermatology

## 2021-01-03 ENCOUNTER — Ambulatory Visit: Payer: BC Managed Care – PPO | Admitting: Dermatology

## 2021-01-03 DIAGNOSIS — L821 Other seborrheic keratosis: Secondary | ICD-10-CM

## 2021-01-03 DIAGNOSIS — L578 Other skin changes due to chronic exposure to nonionizing radiation: Secondary | ICD-10-CM | POA: Diagnosis not present

## 2021-01-03 DIAGNOSIS — Q828 Other specified congenital malformations of skin: Secondary | ICD-10-CM | POA: Diagnosis not present

## 2021-01-03 DIAGNOSIS — Z1283 Encounter for screening for malignant neoplasm of skin: Secondary | ICD-10-CM | POA: Diagnosis not present

## 2021-01-03 DIAGNOSIS — D229 Melanocytic nevi, unspecified: Secondary | ICD-10-CM

## 2021-01-03 DIAGNOSIS — L570 Actinic keratosis: Secondary | ICD-10-CM | POA: Diagnosis not present

## 2021-01-03 DIAGNOSIS — D225 Melanocytic nevi of trunk: Secondary | ICD-10-CM

## 2021-01-03 DIAGNOSIS — D239 Other benign neoplasm of skin, unspecified: Secondary | ICD-10-CM

## 2021-01-03 DIAGNOSIS — Z872 Personal history of diseases of the skin and subcutaneous tissue: Secondary | ICD-10-CM

## 2021-01-03 DIAGNOSIS — L814 Other melanin hyperpigmentation: Secondary | ICD-10-CM

## 2021-01-03 DIAGNOSIS — D485 Neoplasm of uncertain behavior of skin: Secondary | ICD-10-CM

## 2021-01-03 DIAGNOSIS — D1801 Hemangioma of skin and subcutaneous tissue: Secondary | ICD-10-CM

## 2021-01-03 HISTORY — DX: Other benign neoplasm of skin, unspecified: D23.9

## 2021-01-03 NOTE — Patient Instructions (Addendum)

## 2021-01-03 NOTE — Progress Notes (Signed)
Follow-Up Visit   Subjective  Juan Dunlap is a 65 y.o. male who presents for the following: Annual Exam (Hx AK's, ISK's ). The patient presents for Total-Body Skin Exam (TBSE) for skin cancer screening and mole check.  The patient has spots, moles and lesions to be evaluated, some may be new or changing.  The following portions of the chart were reviewed this encounter and updated as appropriate:   Tobacco   Allergies   Meds   Problems   Med Hx   Surg Hx   Fam Hx      Review of Systems:  No other skin or systemic complaints except as noted in HPI or Assessment and Plan.  Objective  Well appearing patient in no apparent distress; mood and affect are within normal limits.  A full examination was performed including scalp, head, eyes, ears, nose, lips, neck, chest, axillae, abdomen, back, buttocks, bilateral upper extremities, bilateral lower extremities, hands, feet, fingers, toes, fingernails, and toenails. All findings within normal limits unless otherwise noted below.  L forearm 2.5 cm   R back paraspinal 0.7 cm irregular brown macule.  Face x 2 (2) Erythematous thin papules/macules with gritty scale.   L med abdomen 1.1 x 1.0 brown macule.   L lat abdomen 1.5 x 1.0 brown macule.          Assessment & Plan  Porokeratosis L forearm  Benign-appearing.  Observation.  Call clinic for new or changing lesions.  Recommend daily use of broad spectrum spf 30+ sunscreen to sun-exposed areas.    Neoplasm of uncertain behavior of skin R back paraspinal  Epidermal / dermal shaving  Lesion diameter (cm):  0.7 Informed consent: discussed and consent obtained   Timeout: patient name, date of birth, surgical site, and procedure verified   Procedure prep:  Patient was prepped and draped in usual sterile fashion Prep type:  Isopropyl alcohol Anesthesia: the lesion was anesthetized in a standard fashion   Anesthetic:  1% lidocaine w/ epinephrine 1-100,000 buffered w/ 8.4%  NaHCO3 Instrument used: flexible razor blade   Hemostasis achieved with: pressure, aluminum chloride and electrodesiccation   Outcome: patient tolerated procedure well   Post-procedure details: sterile dressing applied and wound care instructions given   Dressing type: bandage and petrolatum    Specimen 1 - Surgical pathology Differential Diagnosis: D48.5 r/o dysplastic nevus  Check Margins: No  AK (actinic keratosis) (2) Face x 2  Destruction of lesion - Face x 2 Complexity: simple   Destruction method: cryotherapy   Informed consent: discussed and consent obtained   Timeout:  patient name, date of birth, surgical site, and procedure verified Lesion destroyed using liquid nitrogen: Yes   Region frozen until ice ball extended beyond lesion: Yes   Outcome: patient tolerated procedure well with no complications   Post-procedure details: wound care instructions given    Nevus (2) L lat abdomen; L med abdomen  Benign-appearing.  Observation.  Call clinic for new or changing moles.  Recommend daily use of broad spectrum spf 30+ sunscreen to sun-exposed areas.   Skin cancer screening  Lentigines - Scattered tan macules - Due to sun exposure - Benign-appearing, observe - Recommend daily broad spectrum sunscreen SPF 30+ to sun-exposed areas, reapply every 2 hours as needed. - Call for any changes  Seborrheic Keratoses - Stuck-on, waxy, tan-brown papules and/or plaques  - Benign-appearing - Discussed benign etiology and prognosis. - Observe - Call for any changes  Melanocytic Nevi - Tan-brown and/or pink-flesh-colored symmetric macules  and papules - Benign appearing on exam today - Observation - Call clinic for new or changing moles - Recommend daily use of broad spectrum spf 30+ sunscreen to sun-exposed areas.   Hemangiomas - Red papules - Discussed benign nature - Observe - Call for any changes  Actinic Damage - Chronic condition, secondary to cumulative UV/sun  exposure - diffuse scaly erythematous macules with underlying dyspigmentation - Recommend daily broad spectrum sunscreen SPF 30+ to sun-exposed areas, reapply every 2 hours as needed.  - Staying in the shade or wearing long sleeves, sun glasses (UVA+UVB protection) and wide brim hats (4-inch brim around the entire circumference of the hat) are also recommended for sun protection.  - Call for new or changing lesions.  Dermatofibroma - Firm pink/brown papulenodule with dimple sign - Benign appearing - Call for any changes   Skin cancer screening performed today.  Return in about 1 year (around 01/03/2022) for TBSE.  Luther Redo, CMA, am acting as scribe for Sarina Ser, MD . Documentation: I have reviewed the above documentation for accuracy and completeness, and I agree with the above.  Sarina Ser, MD

## 2021-01-08 ENCOUNTER — Encounter: Payer: Self-pay | Admitting: Dermatology

## 2021-01-09 ENCOUNTER — Telehealth: Payer: Self-pay

## 2021-01-09 NOTE — Telephone Encounter (Signed)
-----   Message from Ralene Bathe, MD sent at 01/05/2021  5:53 PM EST ----- Diagnosis Skin , R back paraspinal DYSPLASTIC COMPOUND NEVUS WITH MODERATE ATYPIA, DEEP MARGIN INVOLVED  Moderate dysplastic Recheck next visit

## 2021-01-09 NOTE — Telephone Encounter (Signed)
Left message for patient to call office for results/hd 

## 2021-01-26 ENCOUNTER — Telehealth: Payer: Self-pay

## 2021-01-26 NOTE — Telephone Encounter (Signed)
Advised patient of results/hd  

## 2021-01-26 NOTE — Telephone Encounter (Signed)
-----   Message from Ralene Bathe, MD sent at 01/05/2021  5:53 PM EST ----- Diagnosis Skin , R back paraspinal DYSPLASTIC COMPOUND NEVUS WITH MODERATE ATYPIA, DEEP MARGIN INVOLVED  Moderate dysplastic Recheck next visit

## 2021-02-08 DIAGNOSIS — R001 Bradycardia, unspecified: Secondary | ICD-10-CM | POA: Insufficient documentation

## 2022-01-04 ENCOUNTER — Ambulatory Visit: Payer: BC Managed Care – PPO | Admitting: Dermatology

## 2022-01-04 ENCOUNTER — Other Ambulatory Visit: Payer: Self-pay | Admitting: Family Medicine

## 2022-01-04 DIAGNOSIS — E785 Hyperlipidemia, unspecified: Secondary | ICD-10-CM

## 2022-01-19 ENCOUNTER — Ambulatory Visit
Admission: RE | Admit: 2022-01-19 | Discharge: 2022-01-19 | Disposition: A | Discharge status: Home / Self Care | Payer: Self-pay | Source: Ambulatory Visit | Attending: Family Medicine | Admitting: Family Medicine

## 2022-01-19 DIAGNOSIS — E785 Hyperlipidemia, unspecified: Secondary | ICD-10-CM | POA: Insufficient documentation

## 2022-02-01 IMAGING — MR MR KNEE*R* W/O CM
7 series · 40 of 40 positions shown · non-contrast
Comparison: None.

CLINICAL DATA: Right knee pain for the past 6 weeks. No injury or
prior surgery.

EXAM:
MRI OF THE RIGHT KNEE WITHOUT CONTRAST
TECHNIQUE: Multiplanar, multisequence MR imaging of the knee was performed. No
intravenous contrast was administered.

[Series 8: T2 fat-sat · axial · right · 4.0mm · 0.50mm/px · z∈[-80,+44]mm · 6 of 26 slices shown (1 of 3)]
[im 1/26]
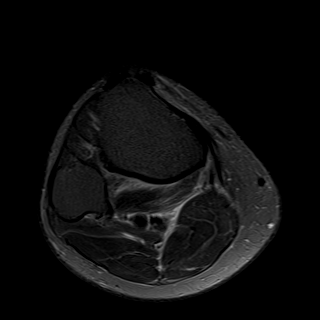
[im 6/26]
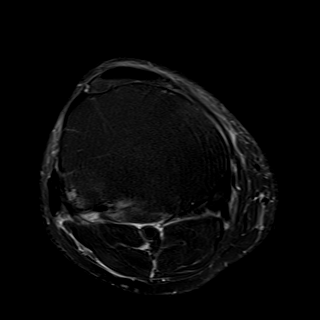
[im 11/26]
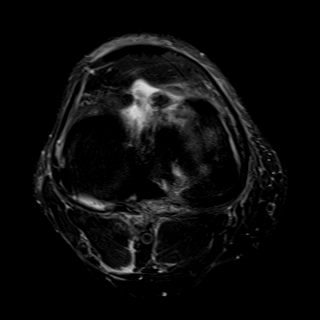
[im 16/26]
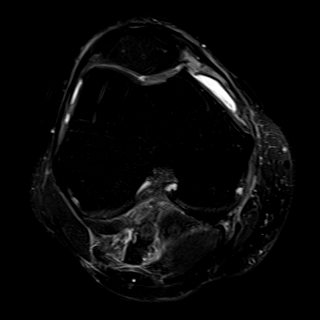
[im 21/26]
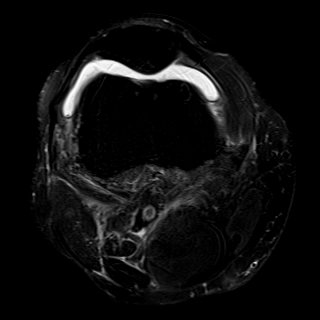
[im 26/26]
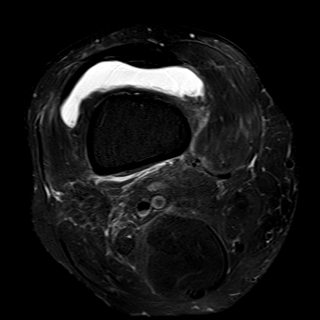

[Series 9: T1 · coronal · right · 4.0mm · 0.47mm/px · 6 of 32 slices shown]
[im 1/32]
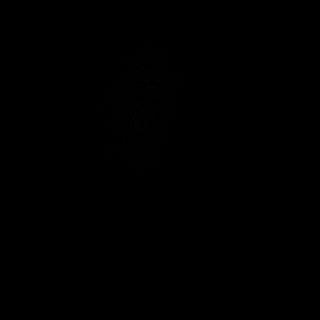
[im 7/32]
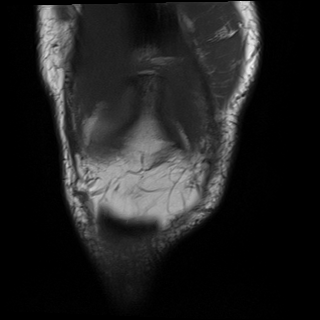
[im 13/32]
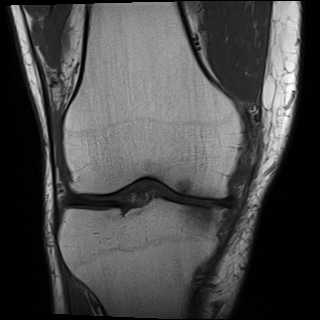
[im 19/32]
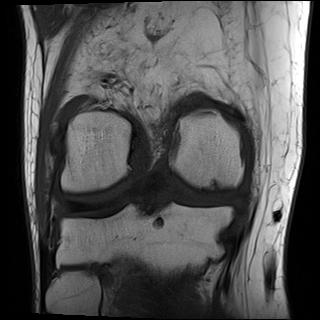
[im 25/32]
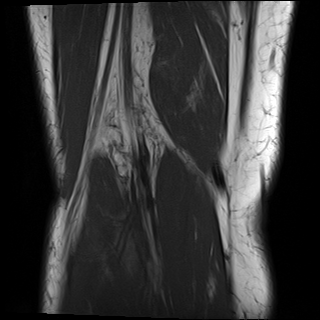
[im 32/32]
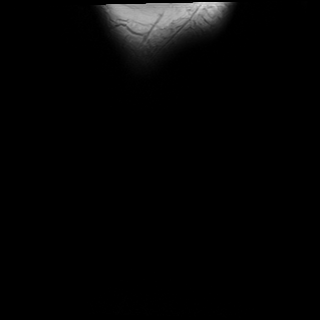

[Series 10: T2 fat-sat · coronal · right · 4.0mm · 0.47mm/px · 6 of 32 slices shown (2 of 3)]
[im 1/32]
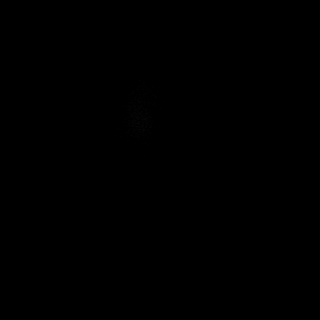
[im 7/32]
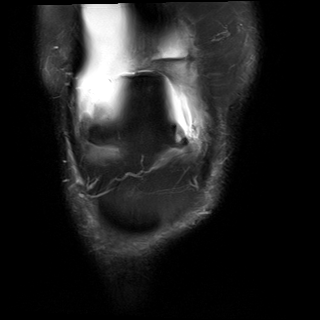
[im 13/32]
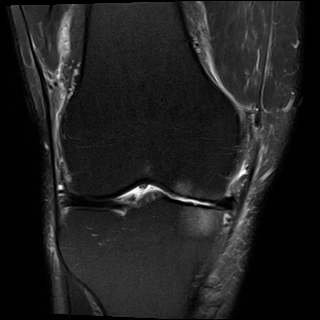
[im 19/32]
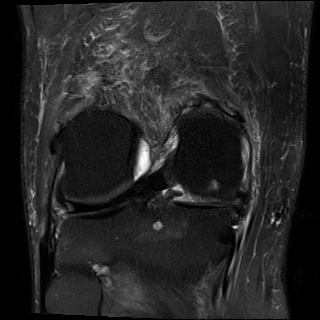
[im 25/32]
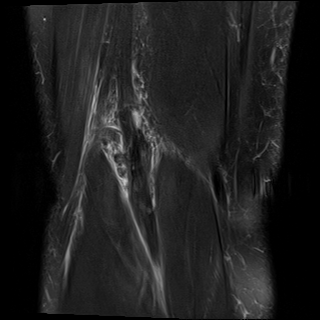
[im 32/32]
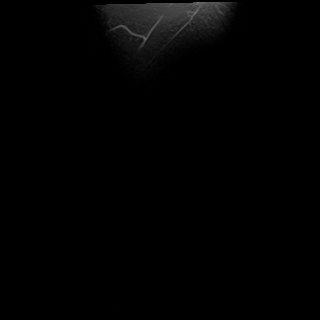

[Series 11: PD fat-sat · coronal · right · 4.0mm · 0.59mm/px · 6 of 32 slices shown (1 of 2)]
[im 1/32]
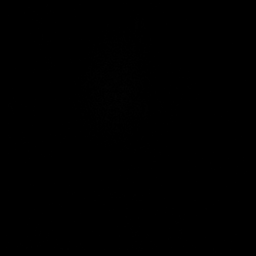
[im 7/32]
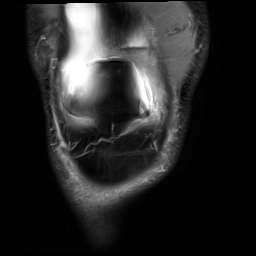
[im 13/32]
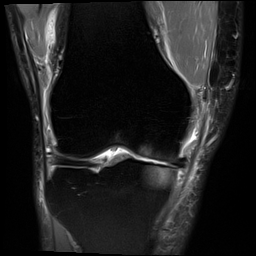
[im 19/32]
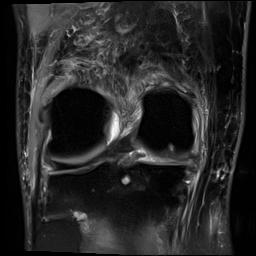
[im 25/32]
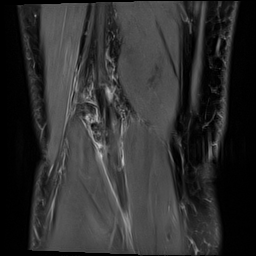
[im 32/32]
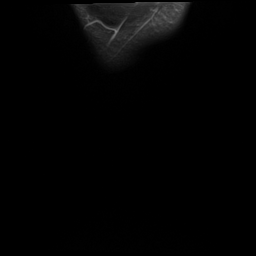

[Series 12: PD fat-sat · sagittal · right · 3.0mm · 0.47mm/px · 7 of 33 slices shown (2 of 2)]
[im 1/33]
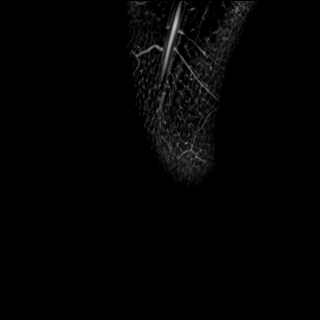
[im 6/33]
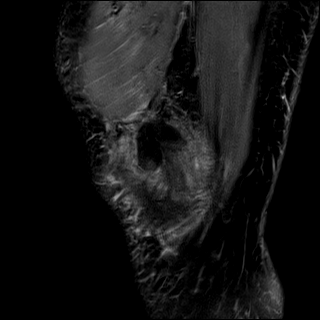
[im 11/33]
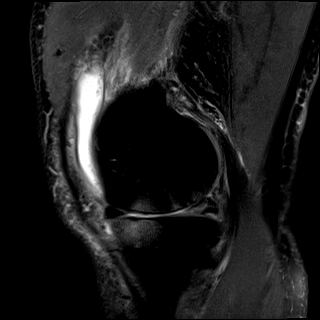
[im 17/33]
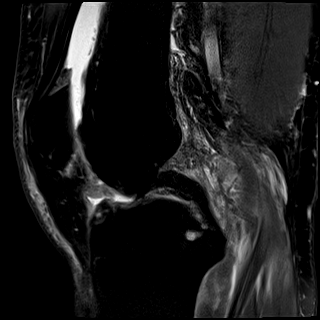
[im 22/33]
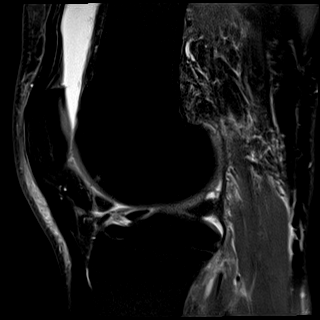
[im 27/33]
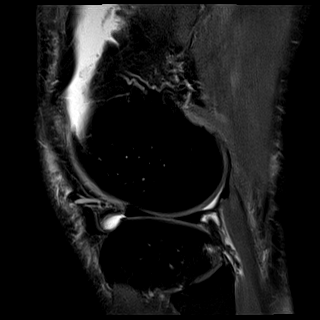
[im 33/33]
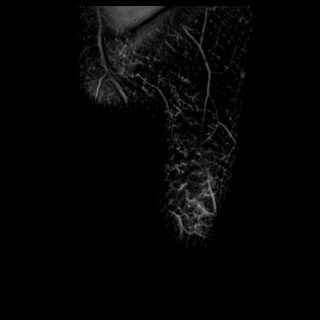

[Series 13: T2 fat-sat · sagittal · right · 3.0mm · 0.47mm/px · 7 of 34 slices shown (3 of 3)]
[im 1/34]
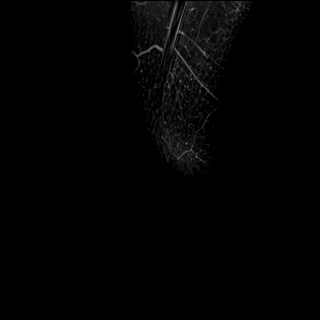
[im 6/34]
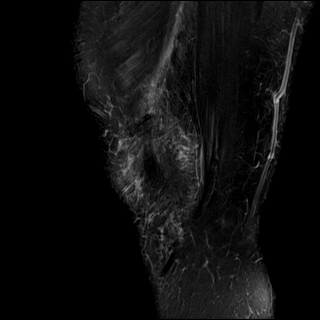
[im 12/34]
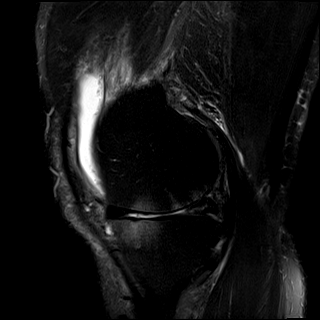
[im 17/34]
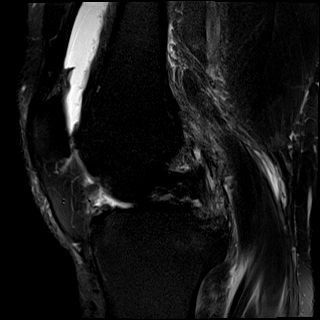
[im 23/34]
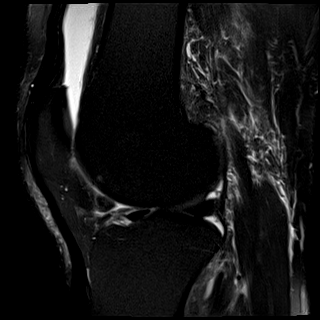
[im 28/34]
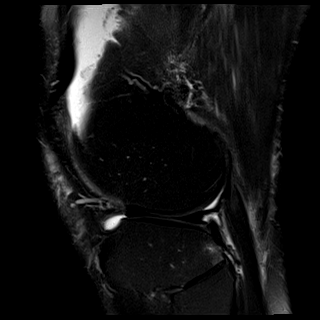
[im 34/34]
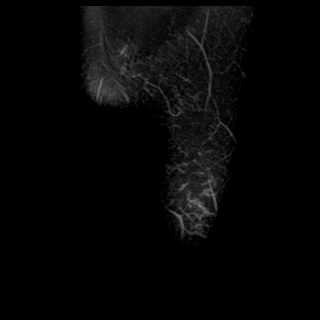

[Series 14: PD · coronal · right · 2.0mm · 0.47mm/px · 2 of 10 slices shown]
[im 1/10]
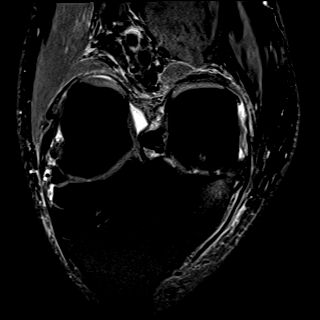
[im 10/10]
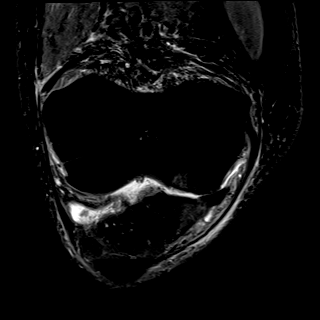

[40 of 40 positions shown; findings below may reference images not displayed]

FINDINGS: MENISCI

Medial meniscus: Near complete tear through the posterior root with
extrusion of the body. Horizontal tear of the body extending into
the posterior horn. Additional free edge fraying of the posterior
horn.

Lateral meniscus:  Intact.

LIGAMENTS

Cruciates:  Intact ACL and PCL.

Collaterals: Medial collateral ligament is intact. Lateral
collateral ligament complex is intact.

CARTILAGE

Patellofemoral: Full-thickness cartilage loss over the trochlear
groove.

Medial: Full-thickness cartilage loss over the central and posterior
weight-bearing medial femoral condyle and peripheral medial tibial
plateau with subchondral marrow edema.

Lateral:  No chondral defect.

Joint:  Moderate joint effusion.  Normal Hoffa's fat.

Popliteal Fossa:  No Baker cyst. Intact popliteus tendon.

Extensor Mechanism: Intact quadriceps tendon and patellar tendon.
Intact medial and lateral patellar retinaculum. Intact MPFL.

Bones: No acute fracture or dislocation. No suspicious bone lesion.
Tricompartmental marginal osteophytes.

Other: None.
IMPRESSION: 1. Near complete tear through the medial meniscus posterior root
with extrusion of the body. Additional horizontal tear of the body
extending into the posterior horn.
2. Moderate medial and mild patellofemoral compartment
osteoarthritis.
3. Moderate joint effusion.

## 2022-02-26 ENCOUNTER — Ambulatory Visit: Payer: Medicare PPO | Admitting: Dermatology

## 2022-02-26 ENCOUNTER — Encounter: Payer: Self-pay | Admitting: Dermatology

## 2022-02-26 VITALS — BP 148/67 | HR 43

## 2022-02-26 DIAGNOSIS — L821 Other seborrheic keratosis: Secondary | ICD-10-CM

## 2022-02-26 DIAGNOSIS — Z86018 Personal history of other benign neoplasm: Secondary | ICD-10-CM | POA: Diagnosis not present

## 2022-02-26 DIAGNOSIS — Z1283 Encounter for screening for malignant neoplasm of skin: Secondary | ICD-10-CM

## 2022-02-26 DIAGNOSIS — L570 Actinic keratosis: Secondary | ICD-10-CM | POA: Diagnosis not present

## 2022-02-26 DIAGNOSIS — I872 Venous insufficiency (chronic) (peripheral): Secondary | ICD-10-CM

## 2022-02-26 DIAGNOSIS — D229 Melanocytic nevi, unspecified: Secondary | ICD-10-CM

## 2022-02-26 DIAGNOSIS — L814 Other melanin hyperpigmentation: Secondary | ICD-10-CM

## 2022-02-26 DIAGNOSIS — D225 Melanocytic nevi of trunk: Secondary | ICD-10-CM

## 2022-02-26 DIAGNOSIS — L578 Other skin changes due to chronic exposure to nonionizing radiation: Secondary | ICD-10-CM

## 2022-02-26 NOTE — Patient Instructions (Signed)
Cryotherapy Aftercare  Wash gently with soap and water everyday.   Apply Vaseline daily until healed.    Melanoma ABCDEs  Melanoma is the most dangerous type of skin cancer, and is the leading cause of death from skin disease.  You are more likely to develop melanoma if you: Have light-colored skin, light-colored eyes, or red or blond hair Spend a lot of time in the sun Tan regularly, either outdoors or in a tanning bed Have had blistering sunburns, especially during childhood Have a close family member who has had a melanoma Have atypical moles or large birthmarks  Early detection of melanoma is key since treatment is typically straightforward and cure rates are extremely high if we catch it early.   The first sign of melanoma is often a change in a mole or a new dark spot.  The ABCDE system is a way of remembering the signs of melanoma.  A for asymmetry:  The two halves do not match. B for border:  The edges of the growth are irregular. C for color:  A mixture of colors are present instead of an even brown color. D for diameter:  Melanomas are usually (but not always) greater than 68m - the size of a pencil eraser. E for evolution:  The spot keeps changing in size, shape, and color.  Please check your skin once per month between visits. You can use a small mirror in front and a large mirror behind you to keep an eye on the back side or your body.   If you see any new or changing lesions before your next follow-up, please call to schedule a visit.  Please continue daily skin protection including broad spectrum sunscreen SPF 30+ to sun-exposed areas, reapplying every 2 hours as needed when you're outdoors.   Staying in the shade or wearing long sleeves, sun glasses (UVA+UVB protection) and wide brim hats (4-inch brim around the entire circumference of the hat) are also recommended for sun protection.      Due to recent changes in healthcare laws, you may see results of your  pathology and/or laboratory studies on MyChart before the doctors have had a chance to review them. We understand that in some cases there may be results that are confusing or concerning to you. Please understand that not all results are received at the same time and often the doctors may need to interpret multiple results in order to provide you with the best plan of care or course of treatment. Therefore, we ask that you please give [place] business days to thoroughly review all your results before contacting the office for clarification. Should we see a critical lab result, you will be contacted sooner.   If You Need Anything After Your Visit  If you have any questions or concerns for your doctor, please call our main line at [phone] press option 4 to reach your doctor's medical assistant. If no one answers, please leave a voicemail as directed and we will return your call as soon as possible. Messages left after 4 pm will be answered the following business day.   You may also send [place] message via [facility] We typically respond to MyChart messages within 1-2 business days.  For prescription refills, please ask your pharmacy to contact our office. Our fax number is [phone]  If you have an urgent issue when the clinic is closed that cannot wait until the next business day, you can page your doctor at the number below.  below.    Please note that while we do our best to be available for urgent issues outside of office hours, we are not available 24/7.   If you have an urgent issue and are unable to reach us, you may choose to seek medical care at your doctor's office, retail clinic, urgent care center, or emergency room.  If you have a medical emergency, please immediately call 911 or go to the emergency department.  Pager Numbers  - Dr. Kowalski: [phone]  - Dr. Moye: [phone]  - Dr. Stewart: [phone]  In the event of inclement weather, please call our main line at  [phone] for an update on the status of any delays or closures.  Dermatology Medication Tips: Please keep the boxes that topical medications come in in order to help keep track of the instructions about where and how to use these. Pharmacies typically print the medication instructions only on the boxes and not directly on the medication tubes.   If your medication is too expensive, please contact our office at [phone] option 4 or send us a message through MyChart.   We are unable to tell what your co-pay for medications will be in advance as this is different depending on your insurance coverage. However, we may be able to find a substitute medication at lower cost or fill out paperwork to get insurance to cover a needed medication.   If a prior authorization is required to get your medication covered by your insurance company, please allow us 1-2 business days to complete this process.  Drug prices often vary depending on where the prescription is filled and some pharmacies may offer cheaper prices.  The website www.goodrx.com contains coupons for medications through different pharmacies. The prices here do not account for what the cost may be with help from insurance (it may be cheaper with your insurance), but the website can give you the price if you did not use any insurance.  - You can print the associated coupon and take it with your prescription to the pharmacy.  - You may also stop by our office during regular business hours and pick up a GoodRx coupon card.  - If you need your prescription sent electronically to a different pharmacy, notify our office through [facility] MyChart or by phone at [phone] option 4.     Si Usted Necesita Algo Despus de Su Visita  Tambin puede enviarnos un mensaje a travs de MyChart. Por lo general respondemos a los mensajes de MyChart en el transcurso de 1 a 2 das hbiles.  Para renovar recetas, por favor pida a su farmacia que se  ponga en contacto con nuestra oficina. Nuestro nmero de fax es el [phone].  Si tiene un asunto urgente cuando la clnica est cerrada y que no puede esperar hasta el siguiente da hbil, puede llamar/localizar a su doctor(a) al nmero que aparece a continuacin.   Por favor, tenga en cuenta que aunque hacemos todo lo posible para estar disponibles para asuntos urgentes fuera del horario de oficina, no estamos disponibles las 24 horas del da, los 7 das de la semana.   Si tiene un problema urgente y no puede comunicarse con nosotros, puede optar por buscar atencin mdica  en el consultorio de su doctor(a), en una clnica privada, en un centro de atencin urgente o en una sala de emergencias.  Si tiene una emergencia mdica, por favor llame inmediatamente al 911 o vaya a la sala de emergencias.  Nmeros de bper  -   Dr. Kowalski: [phone]  - Dra. Moye: [phone]  - Dra. Stewart: [phone]  En caso de inclemencias del tiempo, por favor llame a nuestra lnea principal al [phone] para una actualizacin sobre el estado de cualquier retraso o cierre.  Consejos para la medicacin en dermatologa: Por favor, guarde las cajas en las que vienen los medicamentos de uso tpico para ayudarle a seguir las instrucciones sobre dnde y cmo usarlos. Las farmacias generalmente imprimen las instrucciones del medicamento slo en las cajas y no directamente en los tubos del medicamento.   Si su medicamento es muy caro, por favor, pngase en contacto con nuestra oficina llamando al [phone] y presione la opcin 4 o envenos un mensaje a travs de MyChart.   No podemos decirle cul ser su copago por los medicamentos por adelantado ya que esto es diferente dependiendo de la cobertura de su seguro. Sin embargo, es posible que podamos encontrar un medicamento sustituto a menor costo o llenar un formulario para que el seguro cubra el medicamento que se considera necesario.   Si se requiere  una autorizacin previa para que su compaa de seguros cubra su medicamento, por favor permtanos de 1 a 2 das hbiles para completar este proceso.  Los precios de los medicamentos varan con frecuencia dependiendo del lugar de dnde se surte la receta y alguna farmacias pueden ofrecer precios ms baratos.  El sitio web www.goodrx.com tiene cupones para medicamentos de diferentes farmacias. Los precios aqu no tienen en cuenta lo que podra costar con la ayuda del seguro (puede ser ms barato con su seguro), pero el sitio web puede darle el precio si no utiliz ningn seguro.  - Puede imprimir el cupn correspondiente y llevarlo con su receta a la farmacia.  - Tambin puede pasar por nuestra oficina durante el horario de atencin regular y recoger una tarjeta de cupones de GoodRx.  - Si necesita que su receta se enve electrnicamente a una farmacia diferente, informe a nuestra oficina a travs de MyChart de [facility] o por telfono llamando al [phone] y presione la opcin 4.  

## 2022-02-26 NOTE — Progress Notes (Unsigned)
Follow-Up Visit   Subjective  Juan Dunlap is a 67 y.o. male who presents for the following: Annual Exam (Hx of dysplastic nevus). The patient presents for Total-Body Skin Exam (TBSE) for skin cancer screening and mole check.  The patient has spots, moles and lesions to be evaluated, some may be new or changing and the patient has concerns that these could be cancer.  The following portions of the chart were reviewed this encounter and updated as appropriate:  Tobacco  Allergies  Meds  Problems  Med Hx  Surg Hx  Fam Hx     Review of Systems: No other skin or systemic complaints except as noted in HPI or Assessment and Plan.  Objective  Well appearing patient in no apparent distress; mood and affect are within normal limits.  A full examination was performed including scalp, head, eyes, ears, nose, lips, neck, chest, axillae, abdomen, back, buttocks, bilateral upper extremities, bilateral lower extremities, hands, feet, fingers, toes, fingernails, and toenails. All findings within normal limits unless otherwise noted below.  left lateral abdomen, left medial abdomen L med abdomen 1.1 x 1.0 brown macule.    L lat abdomen 1.5 x 1.0 brown macule  B/L lower legs Erythematous, scaly patches involving the ankle and distal lower leg with associated lower leg edema.   Dorsum Nose x1 Erythematous thin papules/macules with gritty scale.    Assessment & Plan   History of Dysplastic Nevus. Right back, paraspinal. Moderate. 01/03/2021 - No evidence of recurrence today - Recommend regular full body skin exams - Recommend daily broad spectrum sunscreen SPF 30+ to sun-exposed areas, reapply every 2 hours as needed.  - Call if any new or changing lesions are noted between office visits  Lentigines - Scattered tan macules - Due to sun exposure - Benign-appearing, observe - Recommend daily broad spectrum sunscreen SPF 30+ to sun-exposed areas, reapply every 2 hours as needed. - Call  for any changes  Seborrheic Keratoses - Stuck-on, waxy, tan-brown papules and/or plaques  - Benign-appearing - Discussed benign etiology and prognosis. - Observe - Call for any changes  Melanocytic Nevi - Tan-brown and/or pink-flesh-colored symmetric macules and papules - Benign appearing on exam today - Observation - Call clinic for new or changing moles - Recommend daily use of broad spectrum spf 30+ sunscreen to sun-exposed areas.   Hemangiomas - Red papules - Discussed benign nature - Observe - Call for any changes  Actinic Damage - Chronic condition, secondary to cumulative UV/sun exposure - diffuse scaly erythematous macules with underlying dyspigmentation - Recommend daily broad spectrum sunscreen SPF 30+ to sun-exposed areas, reapply every 2 hours as needed.  - Staying in the shade or wearing long sleeves, sun glasses (UVA+UVB protection) and wide brim hats (4-inch brim around the entire circumference of the hat) are also recommended for sun protection.  - Call for new or changing lesions.  Skin cancer screening performed today.  Nevus (2) left lateral abdomen; left medial abdomen Benign-appearing. Stable compared to previous visit. Observation.  Call clinic for new or changing moles.  Recommend daily use of broad spectrum spf 30+ sunscreen to sun-exposed areas.   Stasis dermatitis of both legs B/L lower legs With schamberg's purpura Stasis in the legs causes chronic leg swelling, which may result in itchy or painful rashes, skin discoloration, skin texture changes, and sometimes ulceration.  Recommend daily graduated compression hose/stockings- easiest to put on first thing in morning, remove at bedtime.  Elevate legs as much as possible. Avoid salt/sodium rich  foods.  AK (actinic keratosis) Dorsum Nose x1 Actinic keratoses are precancerous spots that appear secondary to cumulative UV radiation exposure/sun exposure over time. They are chronic with expected duration  over 1 year. A portion of actinic keratoses will progress to squamous cell carcinoma of the skin. It is not possible to reliably predict which spots will progress to skin cancer and so treatment is recommended to prevent development of skin cancer.  Recommend daily broad spectrum sunscreen SPF 30+ to sun-exposed areas, reapply every 2 hours as needed.  Recommend staying in the shade or wearing long sleeves, sun glasses (UVA+UVB protection) and wide brim hats (4-inch brim around the entire circumference of the hat). Call for new or changing lesions.  Destruction of lesion - Dorsum Nose x1 Complexity: simple   Destruction method: cryotherapy   Informed consent: discussed and consent obtained   Timeout:  patient name, date of birth, surgical site, and procedure verified Lesion destroyed using liquid nitrogen: Yes   Region frozen until ice ball extended beyond lesion: Yes   Outcome: patient tolerated procedure well with no complications   Post-procedure details: wound care instructions given   Additional details:  Prior to procedure, discussed risks of blister formation, small wound, skin dyspigmentation, or rare scar following cryotherapy. Recommend Vaseline ointment to treated areas while healing.   Return for AK Follow Up in 3 months, TBSE in 1 year.  I, Emelia Salisbury, CMA, am acting as scribe for Sarina Ser, MD. Documentation: I have reviewed the above documentation for accuracy and completeness, and I agree with the above.  Sarina Ser, MD

## 2022-02-27 ENCOUNTER — Encounter: Payer: Self-pay | Admitting: Dermatology

## 2022-03-08 ENCOUNTER — Ambulatory Visit: Payer: BC Managed Care – PPO | Admitting: Dermatology

## 2022-03-22 ENCOUNTER — Ambulatory Visit: Payer: BC Managed Care – PPO | Admitting: Dermatology

## 2022-05-08 ENCOUNTER — Encounter: Payer: Self-pay | Admitting: Urology

## 2022-05-08 ENCOUNTER — Ambulatory Visit: Payer: Medicare PPO | Admitting: Urology

## 2022-05-08 VITALS — BP 149/66 | HR 41 | Ht 74.0 in | Wt 224.0 lb

## 2022-05-08 DIAGNOSIS — R350 Frequency of micturition: Secondary | ICD-10-CM | POA: Diagnosis not present

## 2022-05-08 DIAGNOSIS — R972 Elevated prostate specific antigen [PSA]: Secondary | ICD-10-CM

## 2022-05-08 LAB — MICROSCOPIC EXAMINATION

## 2022-05-08 LAB — URINALYSIS, COMPLETE
Bilirubin, UA: NEGATIVE
Glucose, UA: NEGATIVE
Ketones, UA: NEGATIVE
Leukocytes,UA: NEGATIVE
Nitrite, UA: NEGATIVE
Protein,UA: NEGATIVE
RBC, UA: NEGATIVE
Specific Gravity, UA: 1.025 (ref 1.005–1.030)
Urobilinogen, Ur: 0.2 mg/dL (ref 0.2–1.0)
pH, UA: 5.5 (ref 5.0–7.5)

## 2022-05-08 NOTE — Progress Notes (Signed)
I, Amy L Pierron,acting as a scribe for Vanna Scotland, MD.,have documented all relevant documentation on the behalf of Vanna Scotland, MD,as directed by  Vanna Scotland, MD while in the presence of Vanna Scotland, MD.  05/08/2022 10:19 AM   Juan Dunlap Jan 18, 1956 [id number]  Referring provider: [name], MD [address] [facility] [place],  [place] [address]  Chief Complaint  Patient presents with   Elevated PSA    HPI: 67 year-old male who presents today for the evaluation of elevated PSA.   He was seen at this office in the remote past for history of microscopic hematuria and kidney stones for which he underwent ureteroscopy. His most recent PSA was known to be elevated on December 26, 2021,  at 4.92. He had a repeat on April 10, 2022, and it remained elevated at 4.47. Prior to that his PSA was 2.61 and that was in June of 2020. He doesn't have a recent urinalysis. No recent cross-sectional imaging.  He has a family history (father) of prostate cancer. His father was diagnosed in his late 38's along with Non-Hodgkin's Lymphoma. He said it was was the cause of his death. He had chemo for the lymphoma and radiation for the prostate cancer.    PMH: Past Medical History:  Diagnosis Date   Actinic keratosis    Atrial flutter    Dysplastic nevus 01/03/2021   Right back paraspinal - moderate   Gout 12/04/2018   Hyperlipidemia    Hypertension    borderline-pt states it is higher in md office but home checks are 140's/70's-PT CONTROLS BP WITH DIET AND EXERCISE PER PT   Kidney stones     Surgical History: Past Surgical History:  Procedure Laterality Date   CARDIOVERSION N/A 12/04/2018   Procedure: CARDIOVERSION;  Surgeon: Marcina Millard, MD;  Location: [facility];  Service: Cardiovascular;  Laterality: N/A;   CARDIOVERSION N/A 02/23/2020   Procedure: CARDIOVERSION;  Surgeon: Marcina Millard, MD;  Location: [facility];  Service: Cardiovascular;  Laterality:  N/A;   COLONOSCOPY WITH PROPOFOL N/A 10/23/2019   Procedure: COLONOSCOPY WITH PROPOFOL;  Surgeon: Regis Bill, MD;  Location: [facility];  Service: Endoscopy;  Laterality: N/A;   CYSTOSCOPY W/ RETROGRADES Bilateral 09/07/2015   Procedure: CYSTOSCOPY WITH RETROGRADE PYELOGRAM;  Surgeon: Vanna Scotland, MD;  Location: [facility];  Service: Urology;  Laterality: Bilateral;   CYSTOSCOPY WITH STENT PLACEMENT Left 09/07/2015   Procedure: CYSTOSCOPY WITH STENT PLACEMENT;  Surgeon: Vanna Scotland, MD;  Location: [facility];  Service: Urology;  Laterality: Left;   URETEROSCOPY WITH HOLMIUM LASER LITHOTRIPSY Left 09/07/2015   Procedure: URETEROSCOPY WITH HOLMIUM LASER LITHOTRIPSY;  Surgeon: Vanna Scotland, MD;  Location: [facility];  Service: Urology;  Laterality: Left;   WISDOM TOOTH EXTRACTION      Home Medications:  Allergies as of 05/08/2022   No Known Allergies      Medication List        Accurate as of May 08, 2022 10:19 AM. If you have any questions, ask your nurse or doctor.          COSAMIN DS PO Take 1 tablet by mouth daily.   diltiazem 120 MG 24 hr capsule Commonly known as: CARDIZEM CD Take 120 mg by mouth daily.   flecainide 50 MG tablet Commonly known as: TAMBOCOR Take 50 mg by mouth 2 (two) times daily.   MACULAR HEALTH FORMULA PO Take 1 tablet by mouth daily. Life Extension MacuGuard Ocular Support with Saffron & Astaxanthin  Milk Thistle 140 MG Caps Take 140 mg by mouth daily.   multivitamin with minerals Tabs tablet Take 1 tablet by mouth daily.   omeprazole 20 MG capsule Commonly known as: PRILOSEC Take by mouth.   rivaroxaban 20 MG Tabs tablet Commonly known as: XARELTO   rosuvastatin 10 MG tablet Commonly known as: CRESTOR Take 1 tablet by mouth daily.   Turmeric Curcumin 500 MG Caps Take 500 mg by mouth daily.   VITAMIN C PO Take 1 tablet by mouth daily.   Vitamin D3 50 MCG (2000 UT) capsule Take 2,000 Units by mouth daily.         Family History: Family History  Problem Relation Age of Onset   Prostate cancer Father    Hematuria Father    Kidney Stones Father    Hematuria Brother    Kidney Stones Brother    Kidney disease Neg Hx     Social History:  reports that he has never smoked. He has never used smokeless tobacco. He reports that he does not drink alcohol and does not use drugs.   Physical Exam: BP (!) 149/66   Pulse (!) 41   Ht  (1.88 m)   Wt 224 lb (101.6 kg)   BMI 28.76 kg/m   Constitutional:  Alert and oriented, No acute distress. HEENT: [place] AT, moist mucus membranes.  Trachea midline, no masses. GU:  Prostate 30 grams, diffusely firm, slightly more firm on the left lateral ridge. A few small hemorrhoids present. Neurologic: Grossly intact, no focal deficits, moving all 4 extremities. Psychiatric: Normal mood and affect.   Urinalysis    Component Value Date/Time   COLORURINE YELLOW (A) 05/21/2015 1528   APPEARANCEUR Clear 05/08/2022 0821   LABSPEC 1.017 05/21/2015 1528   PHURINE 5.0 05/21/2015 1528   GLUCOSEU Negative 05/08/2022 0821   HGBUR 1+ (A) 05/21/2015 1528   BILIRUBINUR Negative 05/08/2022 0821   KETONESUR NEGATIVE 05/21/2015 1528   PROTEINUR Negative 05/08/2022 0821   PROTEINUR NEGATIVE 05/21/2015 1528   NITRITE Negative 05/08/2022 0821   NITRITE NEGATIVE 05/21/2015 1528   LEUKOCYTESUR Negative 05/08/2022 0821    Lab Results  Component Value Date   LABMICR See below: 05/08/2022   WBCUA 0-5 05/08/2022   RBCUA >30 (A) 09/15/2015   LABEPIT 0-10 05/08/2022   MUCUS Present (A) 05/08/2022   BACTERIA Moderate (A) 05/08/2022     Assessment & Plan:    History of elevated PSA  -  We reviewed the implications of an elevated PSA and the uncertainty surrounding it. In general, a man's PSA increases with age and is produced by both normal and cancerous prostate tissue. Differential for elevated PSA is BPH, prostate cancer, infection, recent intercourse/ejaculation,  prostate infarction, recent urethroscopic manipulation (foley placement/cystoscopy) and prostatitis. Management of an elevated PSA can include observation or prostate biopsy and we discussed this in detail. We discussed that indications for prostate biopsy are defined by age and race specific PSA cutoffs as well as a PSA velocity of 0.75/year.   - Due to his family history as well as fairly abrupt stable rising PSA in the absence of other clear etiologies, biopsy is recommended.  -We discussed prostate biopsy in detail including the procedure itself, the risks of blood in the urine, stool, and ejaculate, serious infection, and discomfort. He is willing to proceed with this as discussed.  I have reviewed the above documentation for accuracy and completeness, and I agree with the above.   Vanna Scotland, MD  [facility] [address] [place], [place] [address] [phone]

## 2022-05-08 NOTE — Progress Notes (Signed)
Juan Dunlap presents for an office visit. BP today is 149/66. He is complaint with BP medication. Greater than 140/90. Provider  notified. Pt advised to follow up with  cardiologist, had recent ablation done. Pt voiced understanding.

## 2022-05-08 NOTE — Patient Instructions (Signed)
Prostate Biopsy Instructions  Stop all aspirin or blood thinners (aspirin, plavix, coumadin, warfarin, motrin, ibuprofen, advil, aleve, naproxen, naprosyn) for 7 days prior to the procedure.  If you have any questions about stopping these medications, please contact your primary care physician or cardiologist.  Having a light meal prior to the procedure is recommended.  If you are diabetic or have low blood sugar please bring a small snack or glucose tablet.  A Fleets enema is needed to be purchased over the counter at a local pharmacy and used 2 hours before you scheduled appointment.  This can be purchased over the counter at any pharmacy.  Antibiotics will be administered in the clinic at the time of the procedure unless otherwise specified.    Please bring someone with you to the procedure to drive you home.  A follow up appointment has been scheduled for you to receive the results of the biopsy.  If you have any questions or concerns, please feel free to call the office at [phone] or send a Mychart message.    Thank you, Staff at  Urology  

## 2022-05-31 ENCOUNTER — Ambulatory Visit: Payer: Medicare PPO | Admitting: Dermatology

## 2022-05-31 VITALS — BP 118/64 | HR 57

## 2022-05-31 DIAGNOSIS — L578 Other skin changes due to chronic exposure to nonionizing radiation: Secondary | ICD-10-CM

## 2022-05-31 DIAGNOSIS — L814 Other melanin hyperpigmentation: Secondary | ICD-10-CM

## 2022-05-31 DIAGNOSIS — W908XXA Exposure to other nonionizing radiation, initial encounter: Secondary | ICD-10-CM | POA: Diagnosis not present

## 2022-05-31 DIAGNOSIS — L570 Actinic keratosis: Secondary | ICD-10-CM | POA: Diagnosis not present

## 2022-05-31 DIAGNOSIS — X32XXXA Exposure to sunlight, initial encounter: Secondary | ICD-10-CM

## 2022-05-31 NOTE — Progress Notes (Signed)
   Follow-Up Visit   Subjective  Juan Dunlap is a 67 y.o. male who presents for the following: 3 month f/u on dorsum nose precancer treated with LN2.  The patient has spots, moles and lesions to be evaluated, some may be new or changing and the patient may have concern these could be cancer.  The following portions of the chart were reviewed this encounter and updated as appropriate: medications, allergies, medical history  Review of Systems:  No other skin or systemic complaints except as noted in HPI or Assessment and Plan.  Objective  Well appearing patient in no apparent distress; mood and affect are within normal limits. A focused examination was performed of the following areas: Relevant exam findings are noted in the Assessment and Plan.  right temple Erythematous thin papules/macules with gritty scale.    Assessment & Plan    AK (actinic keratosis) right temple  Actinic keratoses are precancerous spots that appear secondary to cumulative UV radiation exposure/sun exposure over time. They are chronic with expected duration over 1 year. A portion of actinic keratoses will progress to squamous cell carcinoma of the skin. It is not possible to reliably predict which spots will progress to skin cancer and so treatment is recommended to prevent development of skin cancer.  Recommend daily broad spectrum sunscreen SPF 30+ to sun-exposed areas, reapply every 2 hours as needed.  Recommend staying in the shade or wearing long sleeves, sun glasses (UVA+UVB protection) and wide brim hats (4-inch brim around the entire circumference of the hat). Call for new or changing lesions.   Destruction of lesion - right temple Complexity: simple   Destruction method: cryotherapy   Informed consent: discussed and consent obtained   Timeout:  patient name, date of birth, surgical site, and procedure verified Lesion destroyed using liquid nitrogen: Yes   Region frozen until ice ball extended  beyond lesion: Yes   Outcome: patient tolerated procedure well with no complications   Post-procedure details: wound care instructions given    ACTINIC DAMAGE - chronic, secondary to cumulative UV radiation exposure/sun exposure over time - diffuse scaly erythematous macules with underlying dyspigmentation - Recommend daily broad spectrum sunscreen SPF 30+ to sun-exposed areas, reapply every 2 hours as needed.  - Recommend staying in the shade or wearing long sleeves, sun glasses (UVA+UVB protection) and wide brim hats (4-inch brim around the entire circumference of the hat). - Call for new or changing lesions.   LENTIGINES Exam: scattered tan macules Due to sun exposure Treatment Plan: Benign-appearing, observe. Recommend daily broad spectrum sunscreen SPF 30+ to sun-exposed areas, reapply every 2 hours as needed.  Call for any changes   Return for scheduled TBSE in 2025.  IAngelique Holm, CMA, am acting as scribe for Armida Sans, MD .   Documentation: I have reviewed the above documentation for accuracy and completeness, and I agree with the above.  Armida Sans, MD

## 2022-05-31 NOTE — Patient Instructions (Addendum)
Cryotherapy Aftercare  Wash gently with soap and water everyday.   Apply Vaseline and Band-Aid daily until healed.     Due to recent changes in healthcare laws, you may see results of your pathology and/or laboratory studies on MyChart before the doctors have had a chance to review them. We understand that in some cases there may be results that are confusing or concerning to you. Please understand that not all results are received at the same time and often the doctors may need to interpret multiple results in order to provide you with the best plan of care or course of treatment. Therefore, we ask that you please give us 2 business days to thoroughly review all your results before contacting the office for clarification. Should we see a critical lab result, you will be contacted sooner.   If You Need Anything After Your Visit  If you have any questions or concerns for your doctor, please call our main line at [phone] and press option 4 to reach your doctor's medical assistant. If no one answers, please leave a voicemail as directed and we will return your call as soon as possible. Messages left after 4 pm will be answered the following business day.   You may also send us a message via MyChart. We typically respond to MyChart messages within 1-2 business days.  For prescription refills, please ask your pharmacy to contact our office. Our fax number is [phone].  If you have an urgent issue when the clinic is closed that cannot wait until the next business day, you can page your doctor at the number below.    Please note that while we do our best to be available for urgent issues outside of office hours, we are not available 24/7.   If you have an urgent issue and are unable to reach us, you may choose to seek medical care at your doctor's office, retail clinic, urgent care center, or emergency room.  If you have a medical emergency, please immediately call 911 or go to the  emergency department.  Pager Numbers  - Dr. Kowalski: [phone]  - Dr. Moye: [phone]  - Dr. Stewart: [phone]  In the event of inclement weather, please call our main line at [phone] for an update on the status of any delays or closures.  Dermatology Medication Tips: Please keep the boxes that topical medications come in in order to help keep track of the instructions about where and how to use these. Pharmacies typically print the medication instructions only on the boxes and not directly on the medication tubes.   If your medication is too expensive, please contact our office at [phone] option 4 or send us a message through MyChart.   We are unable to tell what your co-pay for medications will be in advance as this is different depending on your insurance coverage. However, we may be able to find a substitute medication at lower cost or fill out paperwork to get insurance to cover a needed medication.   If a prior authorization is required to get your medication covered by your insurance company, please allow us 1-2 business days to complete this process.  Drug prices often vary depending on where the prescription is filled and some pharmacies may offer cheaper prices.  The website www.goodrx.com contains coupons for medications through different pharmacies. The prices here do not account for what the cost may be with help from insurance (it may be cheaper with your insurance), but the website can   give you the price if you did not use any insurance.  - You can print the associated coupon and take it with your prescription to the pharmacy.  - You may also stop by our office during regular business hours and pick up a GoodRx coupon card.  - If you need your prescription sent electronically to a different pharmacy, notify our office through [facility] MyChart or by phone at [phone] option 4.     Si Usted Necesita Algo Despus de Su Visita  Tambin puede  enviarnos un mensaje a travs de MyChart. Por lo general respondemos a los mensajes de MyChart en el transcurso de 1 a 2 das hbiles.  Para renovar recetas, por favor pida a su farmacia que se ponga en contacto con nuestra oficina. Nuestro nmero de fax es el [phone].  Si tiene un asunto urgente cuando la clnica est cerrada y que no puede esperar hasta el siguiente da hbil, puede llamar/localizar a su doctor(a) al nmero que aparece a continuacin.   Por favor, tenga en cuenta que aunque hacemos todo lo posible para estar disponibles para asuntos urgentes fuera del horario de oficina, no estamos disponibles las 24 horas del da, los 7 das de la semana.   Si tiene un problema urgente y no puede comunicarse con nosotros, puede optar por buscar atencin mdica  en el consultorio de su doctor(a), en una clnica privada, en un centro de atencin urgente o en una sala de emergencias.  Si tiene una emergencia mdica, por favor llame inmediatamente al 911 o vaya a la sala de emergencias.  Nmeros de bper  - Dr. Kowalski: [phone]  - Dra. Moye: [phone]  - Dra. Stewart: [phone]  En caso de inclemencias del tiempo, por favor llame a nuestra lnea principal al [phone] para una actualizacin sobre el estado de cualquier retraso o cierre.  Consejos para la medicacin en dermatologa: Por favor, guarde las cajas en las que vienen los medicamentos de uso tpico para ayudarle a seguir las instrucciones sobre dnde y cmo usarlos. Las farmacias generalmente imprimen las instrucciones del medicamento slo en las cajas y no directamente en los tubos del medicamento.   Si su medicamento es muy caro, por favor, pngase en contacto con nuestra oficina llamando al [phone] y presione la opcin 4 o envenos un mensaje a travs de MyChart.   No podemos decirle cul ser su copago por los medicamentos por adelantado ya que esto es diferente dependiendo de la cobertura de su seguro.  Sin embargo, es posible que podamos encontrar un medicamento sustituto a menor costo o llenar un formulario para que el seguro cubra el medicamento que se considera necesario.   Si se requiere una autorizacin previa para que su compaa de seguros cubra su medicamento, por favor permtanos de 1 a 2 das hbiles para completar este proceso.  Los precios de los medicamentos varan con frecuencia dependiendo del lugar de dnde se surte la receta y alguna farmacias pueden ofrecer precios ms baratos.  El sitio web www.goodrx.com tiene cupones para medicamentos de diferentes farmacias. Los precios aqu no tienen en cuenta lo que podra costar con la ayuda del seguro (puede ser ms barato con su seguro), pero el sitio web puede darle el precio si no utiliz ningn seguro.  - Puede imprimir el cupn correspondiente y llevarlo con su receta a la farmacia.  - Tambin puede pasar por nuestra oficina durante el horario de atencin regular y recoger una tarjeta de cupones de GoodRx.  -   Si necesita que su receta se enve electrnicamente a una farmacia diferente, informe a nuestra oficina a travs de MyChart de [place] o por telfono llamando al [phone] y presione la opcin 4.  

## 2022-06-09 ENCOUNTER — Encounter: Payer: Self-pay | Admitting: Dermatology

## 2022-06-12 ENCOUNTER — Ambulatory Visit: Payer: Medicare PPO | Admitting: Urology

## 2022-06-12 VITALS — BP 168/83 | HR 49 | Ht 74.0 in | Wt 231.2 lb

## 2022-06-12 DIAGNOSIS — C61 Malignant neoplasm of prostate: Secondary | ICD-10-CM

## 2022-06-12 DIAGNOSIS — N4232 Atypical small acinar proliferation of prostate: Secondary | ICD-10-CM

## 2022-06-12 DIAGNOSIS — R972 Elevated prostate specific antigen [PSA]: Secondary | ICD-10-CM | POA: Diagnosis not present

## 2022-06-12 DIAGNOSIS — Z2989 Encounter for other specified prophylactic measures: Secondary | ICD-10-CM

## 2022-06-12 MED ORDER — GENTAMICIN SULFATE 40 MG/ML IJ SOLN
80.0000 mg | Freq: Once | INTRAMUSCULAR | Status: AC
Start: 2022-06-12 — End: 2022-06-12
  Administered 2022-06-12: 80 mg via INTRAMUSCULAR

## 2022-06-12 MED ORDER — LEVOFLOXACIN 500 MG PO TABS
500.0000 mg | ORAL_TABLET | Freq: Once | ORAL | Status: AC
Start: 2022-06-12 — End: 2022-06-12
  Administered 2022-06-12: 500 mg via ORAL

## 2022-06-12 NOTE — Progress Notes (Signed)
   06/12/22  CC:  Chief Complaint  Patient presents with   Prostate Biopsy    HPI: 67 year old male with personal tree of elevated/rising PSA who presents today for prostate biopsy.  Rectal exam did show a slightly more firm left lateral ridge.  There were no vitals taken for this visit. NED. A&Ox3.   No respiratory distress   Abd soft, NT, ND Normal sphincter tone  Prostate Biopsy Procedure   Informed consent was obtained after discussing risks/benefits of the procedure.  A time out was performed to ensure correct patient identity.  Pre-Procedure: - Gentamicin given prophylactically - Levaquin 500 mg administered PO -Transrectal Ultrasound performed revealing a 47.8 gm prostate -No significant hypoechoic or median lobe noted  Procedure: - Prostate block performed using 10 cc 1% lidocaine and biopsies taken from sextant areas, a total of 12 under ultrasound guidance.  Post-Procedure: - Patient tolerated the procedure well - He was counseled to seek immediate medical attention if experiences any severe pain, significant bleeding, or fevers - Return in one week to discuss biopsy results   Vanna Scotland, MD

## 2022-06-12 NOTE — Progress Notes (Signed)
Juan Dunlap presents for a procedure visit. BP today is __168/83_________. He is complaint with BP medication. Greater than 140/90. Provider  notified. Pt advised to__f/u with cardiologist____________. Pt voiced understanding.

## 2022-06-20 ENCOUNTER — Ambulatory Visit: Payer: Medicare PPO | Admitting: Urology

## 2022-06-26 ENCOUNTER — Telehealth: Payer: Self-pay

## 2022-06-26 ENCOUNTER — Ambulatory Visit: Payer: Medicare PPO | Admitting: Urology

## 2022-06-26 VITALS — BP 160/72 | HR 42 | Ht 74.0 in | Wt 232.2 lb

## 2022-06-26 DIAGNOSIS — C61 Malignant neoplasm of prostate: Secondary | ICD-10-CM | POA: Diagnosis not present

## 2022-06-26 NOTE — Telephone Encounter (Signed)
Please facilitate Oncotype DX testing.  Vanna Scotland, MD

## 2022-06-26 NOTE — Progress Notes (Signed)
Juan Dunlap presents for an office visit. BP today is _160/72____. He is complaint with BP medication. Greater than 140/90. Provider  notified. Pt advised to___f/u with cardiologist_______. Pt voiced understanding.

## 2022-06-26 NOTE — Progress Notes (Signed)
I,Amy L Pierron,acting as a scribe for Vanna Scotland, MD.,have documented all relevant documentation on the behalf of Vanna Scotland, MD,as directed by  Vanna Scotland, MD while in the presence of Vanna Scotland, MD.  06/26/2022 1:51 PM   Juan Dunlap 1955-04-18 [id number]  Referring provider: [name], MD [address] [facility] [place],  [place] [address]  Chief Complaint  Patient presents with   Results    HPI: 67 year-old male with a personal history of elevated PSA returns today for biopsy results.  He underwent prostate biopsy on 06/12/2022. He was known to have a 47 gram prostate. His PSA had been upward trending since late 2023 into this year, as high as 4.47. He does have a family history of prostate cancer. 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  He had an abnormal a rectal exam. Slightly more firm in the left lateral ridge, which is consistent with his pathology.  He does has a family history prostate cancer.  He is accompanied by his wife today as well for the results. He currently does not have any trouble achieving erections.    PMH: Past Medical History:  Diagnosis Date   Actinic keratosis    Atrial flutter (HCC)    Dysplastic nevus 01/03/2021   Right back paraspinal - moderate   Gout 12/04/2018   Hyperlipidemia    Hypertension    borderline-pt states it is higher in md office but home checks are 140's/70's-PT CONTROLS BP WITH DIET AND EXERCISE PER PT   Kidney stones     Surgical History: Past Surgical History:  Procedure Laterality Date   CARDIOVERSION N/A 12/04/2018   Procedure: CARDIOVERSION;  Surgeon: Marcina Millard, MD;  Location: [facility];  Service: Cardiovascular;  Laterality: N/A;   CARDIOVERSION N/A 02/23/2020   Procedure: CARDIOVERSION;  Surgeon: Marcina Millard, MD;  Location: [facility];  Service: Cardiovascular;   Laterality: N/A;   COLONOSCOPY WITH PROPOFOL N/A 10/23/2019   Procedure: COLONOSCOPY WITH PROPOFOL;  Surgeon: Regis Bill, MD;  Location: [facility];  Service: Endoscopy;  Laterality: N/A;   CYSTOSCOPY W/ RETROGRADES Bilateral 09/07/2015   Procedure: CYSTOSCOPY WITH RETROGRADE PYELOGRAM;  Surgeon: Vanna Scotland, MD;  Location: [facility];  Service: Urology;  Laterality: Bilateral;   CYSTOSCOPY WITH STENT PLACEMENT Left 09/07/2015   Procedure: CYSTOSCOPY WITH STENT PLACEMENT;  Surgeon: Vanna Scotland, MD;  Location: [facility];  Service: Urology;  Laterality: Left;   URETEROSCOPY WITH HOLMIUM LASER LITHOTRIPSY Left 09/07/2015   Procedure: URETEROSCOPY WITH HOLMIUM LASER LITHOTRIPSY;  Surgeon: Vanna Scotland, MD;  Location: [facility];  Service: Urology;  Laterality: Left;   WISDOM TOOTH EXTRACTION      Home Medications:  Allergies as of 06/26/2022   No Known Allergies      Medication List        Accurate as of June 26, 2022  1:51 PM. If you have any questions, ask your nurse or doctor.          COSAMIN DS PO Take 1 tablet by mouth daily.   diltiazem 120 MG 24 hr capsule Commonly known as: CARDIZEM CD Take 120 mg by mouth daily.   flecainide 50 MG tablet Commonly known as: TAMBOCOR Take 50 mg by mouth 2 (two) times daily.   MACULAR HEALTH FORMULA PO Take 1 tablet by mouth daily. Life Extension MacuGuard Ocular Support with Saffron & Astaxanthin  Milk Thistle 140 MG Caps Take 140 mg by mouth daily.   multivitamin with minerals Tabs tablet Take 1 tablet by mouth daily.   omeprazole 20 MG capsule Commonly known as: PRILOSEC Take by mouth.   rivaroxaban 20 MG Tabs tablet Commonly known as: XARELTO   rosuvastatin 10 MG tablet Commonly known as: CRESTOR Take 1 tablet by mouth daily.   Turmeric Curcumin 500 MG Caps Take 500 mg by mouth daily.   VITAMIN C PO Take 1 tablet by mouth daily.   Vitamin D3 50 MCG (2000 UT) capsule Take 2,000 Units by mouth daily.         Family History: Family History  Problem Relation Age of Onset   Prostate cancer Father    Hematuria Father    Kidney Stones Father    Hematuria Brother    Kidney Stones Brother    Kidney disease Neg Hx     Social History:  reports that he has never smoked. He has never used smokeless tobacco. He reports that he does not drink alcohol and does not use drugs.   Physical Exam: BP (!) 160/72   Pulse (!) 42   Ht 6\' 2"  (1.88 m)   Wt 232 lb 4 oz (105.3 kg)   BMI 29.82 kg/m   Constitutional:  Alert and oriented, No acute distress. HEENT: [place] AT, moist mucus membranes.  Trachea midline, no masses. Neurologic: Grossly intact, no focal deficits, moving all 4 extremities. Psychiatric: Normal mood and affect.   Assessment & Plan:    Prostate cancer low risk  - He and his wife were counseled about the natural history of prostate cancer and the standard treatment options that are available for prostate cancer. It was explained to him how his age and life expectancy, clinical stage, Gleason score, and PSA affect his prognosis, the decision to proceed with additional staging studies, as well as how that information influences recommended treatment strategies. We discussed the roles for active surveillance, radiation therapy, surgical therapy, androgen deprivation, as well as ablative therapy options for the treatment of prostate cancer as appropriate to his individual cancer situation. We discussed the risks and benefits of these options with regard to their impact on cancer control and also in terms of potential adverse events, complications, and impact on quality of life particularly related to urinary, bowel, and sexual function. They were encouraged to ask questions throughout the discussion today and all questions were answered to his stated satisfaction. In addition, he and his wife were provided with and/or directed to appropriate resources and literature for further education about  prostate cancer treatment options.  -  Explained that generally for low-risk, low-volume prostate cancer like this, the American Urologic Guidelines and National Cancer Institute Guidelines, have all moved away from treating it. They've all moved towards active surveillance because the treatment often is worse than the disease itself.   -  Active surveillance would consist of a PSA no less than every six months. A rectal exam no less than once a year. We usually start using prostate MRI generally six months to a year after the biopsy for baseline to see what your prostate looks like radiographically. Then down the road, if your PSA shoots up by three points, we can get another one and compare to see if there's been a distinct change. Most protocols eventually require another biopsy at a future point.   - Mentioned the possibility of genetic testing. Explained how it works and sometimes is not covered by  insurance. Gave information regarding this and they will call their insurance to check on the coverage for it.  This can be used as a Engineering geologist.  Often results are helpful if extremely high or extremely low risk of adverse pathology.  - Strongly recommend the active surveillance protocol. Due to family history, reasonable to consider doing the genetic testing. If they desire a treatment, surgery is suggested as opposed to radiation due to his age and other factors.   - He and his wife will look into the genetic testing process for now and plan to return in 6 months for repeat PSA and an MRI.   Return in about 6 months (around 12/26/2022) for prostate MRI, PSA.  I have reviewed the above documentation for accuracy and completeness, and I agree with the above.   Vanna Scotland, MD    [facility] [address], [address] [place], [place] [address] [phone]  I spent 47 total minutes on the day of the encounter including pre-visit review of the medical record,  face-to-face time with the patient, and post visit ordering of labs/imaging/tests.

## 2022-06-26 NOTE — Telephone Encounter (Signed)
Pt called in on triage line.  States he checked with his insurance company regarding coverage for genetic testing and was told it is covered- he may have an out of pocket expense of 250.00 which he is ok.  Pt does want to procede with genetic testing.

## 2022-06-26 NOTE — Patient Instructions (Signed)
Prostate MRI Prep:  1- No ejaculation 48 hours prior to exam  2- No caffeine or carbonated beverages on day of the exam  3- Eat light diet evening prior and day of exam  4- Avoid eating 4 hours prior to exam  5- Fleets enema needs to be done 4 hours prior to exam -See below. Can be purchased at the drug store.      

## 2022-06-27 NOTE — Telephone Encounter (Signed)
Working on submitting this

## 2022-06-28 NOTE — Telephone Encounter (Signed)
Report submitted on [email or web address] [id number]

## 2022-07-19 NOTE — Telephone Encounter (Signed)
I placed report on Dr Delana Meyer desk for review.

## 2022-08-23 ENCOUNTER — Encounter: Payer: Self-pay | Admitting: Urology

## 2022-11-28 ENCOUNTER — Ambulatory Visit
Admission: RE | Admit: 2022-11-28 | Discharge: 2022-11-28 | Disposition: A | Discharge status: Home / Self Care | Payer: Medicare PPO | Source: Ambulatory Visit | Attending: Urology | Admitting: Urology

## 2022-11-28 DIAGNOSIS — C61 Malignant neoplasm of prostate: Secondary | ICD-10-CM | POA: Insufficient documentation

## 2022-11-28 MED ORDER — GADOBUTROL 1 MMOL/ML IV SOLN
10.0000 mL | Freq: Once | INTRAVENOUS | Status: AC | PRN
  Administered 2022-11-28: 10 mL via INTRAVENOUS

## 2022-12-18 ENCOUNTER — Other Ambulatory Visit: Payer: Medicare PPO

## 2022-12-18 DIAGNOSIS — C61 Malignant neoplasm of prostate: Secondary | ICD-10-CM

## 2022-12-19 ENCOUNTER — Other Ambulatory Visit: Payer: Medicare PPO

## 2022-12-19 LAB — PSA: Prostate Specific Ag, Serum: 4.3 ng/mL — ABNORMAL HIGH (ref 0.0–4.0)

## 2022-12-21 ENCOUNTER — Other Ambulatory Visit: Payer: Medicare PPO

## 2022-12-25 ENCOUNTER — Ambulatory Visit: Payer: Medicare PPO | Admitting: Urology

## 2022-12-25 VITALS — BP 176/76 | HR 53 | Ht 74.0 in | Wt 236.4 lb

## 2022-12-25 DIAGNOSIS — C61 Malignant neoplasm of prostate: Secondary | ICD-10-CM

## 2022-12-25 NOTE — Progress Notes (Signed)
I,Amy L Pierron,acting as a scribe for Vanna Scotland, MD.,have documented all relevant documentation on the behalf of Vanna Scotland, MD,as directed by  Vanna Scotland, MD while in the presence of Vanna Scotland, MD.  12/25/2022 11:29 AM   Clovis Riley [birth year] [id number]  Referring provider: [name], MD [address] [facility] [place],  [place] [address]  Chief Complaint  Patient presents with   Prostate Cancer    HPI: 67 year-old male with a personal history of low risk prostate cancer on active surveillance presents today for six month follow-up.  He was diagnosed in May of 2024 with a 47 gram prostate. His PSA was at 4.47 around the time of diagnosis. He had Gleason 3, 25% of the core involving three cores on the left and one at the right apex. He had a more firm lateral ridge on the left on rectal exam.   He had a prostate MRI completed on 11/28/2022 which shows a 34.5 gram prostate with PI-RADS 3 of the left posterior lateral peripheral zone. Otherwise, no evidence of extracapsular extension, advanced disease, or any other concerning findings.  In the interim, he had an Oncotype DX GPS score. This was 25, in the middle of low and moderate risk, average score. There's 25% of adverse features pathologically.  He has a family history of prostate cancer.   His most recent PSA on 12/18/2022 is 4.3.  He mentions starting taking Prostagenix which have stopped any nocturia. He has no other urinary symptoms or complaints.    PMH: Past Medical History:  Diagnosis Date   Actinic keratosis    Atrial flutter (HCC)    Dysplastic nevus 01/03/2021   Right back paraspinal - moderate   Gout 12/04/2018   Hyperlipidemia    Hypertension    borderline-pt states it is higher in md office but home checks are 140's/70's-PT CONTROLS BP WITH DIET AND EXERCISE PER PT   Kidney stones     Surgical History: Past Surgical History:  Procedure Laterality Date   CARDIOVERSION  N/A 12/04/2018   Procedure: CARDIOVERSION;  Surgeon: Marcina Millard, MD;  Location: [facility];  Service: Cardiovascular;  Laterality: N/A;   CARDIOVERSION N/A 02/23/2020   Procedure: CARDIOVERSION;  Surgeon: Marcina Millard, MD;  Location: [facility];  Service: Cardiovascular;  Laterality: N/A;   COLONOSCOPY WITH PROPOFOL N/A 10/23/2019   Procedure: COLONOSCOPY WITH PROPOFOL;  Surgeon: Regis Bill, MD;  Location: [facility];  Service: Endoscopy;  Laterality: N/A;   CYSTOSCOPY W/ RETROGRADES Bilateral 09/07/2015   Procedure: CYSTOSCOPY WITH RETROGRADE PYELOGRAM;  Surgeon: Vanna Scotland, MD;  Location: [facility];  Service: Urology;  Laterality: Bilateral;   CYSTOSCOPY WITH STENT PLACEMENT Left 09/07/2015   Procedure: CYSTOSCOPY WITH STENT PLACEMENT;  Surgeon: Vanna Scotland, MD;  Location: [facility];  Service: Urology;  Laterality: Left;   URETEROSCOPY WITH HOLMIUM LASER LITHOTRIPSY Left 09/07/2015   Procedure: URETEROSCOPY WITH HOLMIUM LASER LITHOTRIPSY;  Surgeon: Vanna Scotland, MD;  Location: [facility];  Service: Urology;  Laterality: Left;   WISDOM TOOTH EXTRACTION      Home Medications:  Allergies as of 12/25/2022   No Known Allergies      Medication List        Accurate as of December 25, 2022 11:29 AM. If you have any questions, ask your nurse or doctor.          COSAMIN DS PO Take 1 tablet by mouth daily.   diltiazem 120 MG 24 hr capsule Commonly known as: CARDIZEM  CD Take 120 mg by mouth daily.   flecainide 50 MG tablet Commonly known as: TAMBOCOR Take 50 mg by mouth 2 (two) times daily.   MACULAR HEALTH FORMULA PO Take 1 tablet by mouth daily. Life Extension MacuGuard Ocular Support with Saffron & Astaxanthin   Milk Thistle 140 MG Caps Take 140 mg by mouth daily.   multivitamin with minerals Tabs tablet Take 1 tablet by mouth daily.   omeprazole 20 MG capsule Commonly known as: PRILOSEC Take by mouth.   rivaroxaban 20 MG Tabs tablet Commonly  known as: XARELTO   rosuvastatin 10 MG tablet Commonly known as: CRESTOR Take 1 tablet by mouth daily.   Turmeric Curcumin 500 MG Caps Take 500 mg by mouth daily.   VITAMIN C PO Take 1 tablet by mouth daily.   Vitamin D3 50 MCG (2000 UT) capsule Take 2,000 Units by mouth daily.        Allergies: No Known Allergies  Family History: Family History  Problem Relation Age of Onset   Prostate cancer Father    Hematuria Father    Kidney Stones Father    Hematuria Brother    Kidney Stones Brother    Kidney disease Neg Hx     Social History:  reports that he has never smoked. He has never used smokeless tobacco. He reports that he does not drink alcohol and does not use drugs.   Physical Exam: BP (!) 176/76   Pulse (!) 53   Ht 6\' 2"  (1.88 m)   Wt 236 lb 6 oz (107.2 kg)   BMI 30.35 kg/m   Constitutional:  Alert and oriented, No acute distress. HEENT: [place] AT, moist mucus membranes.  Trachea midline, no masses. Neurologic: Grossly intact, no focal deficits, moving all 4 extremities. Psychiatric: Normal mood and affect.   Pertinent Imaging: CLINICAL DATA:  Low risk prostate cancer under active surveillance. Most recent PSA 4.47.   EXAM: MR PROSTATE WITHOUT AND WITH CONTRAST   TECHNIQUE: Multiplanar multisequence MRI images were obtained of the pelvis centered about the prostate. Pre and post contrast images were obtained.   CONTRAST:  10mL GADAVIST GADOBUTROL 1 MMOL/ML IV SOLN   COMPARISON:  None Available.   FINDINGS: Prostate: Encapsulated nodularity in the transition zone compatible with benign prostatic hypertrophy.   Region of interest # 1: PI-RADS category 3 lesion of the left posterolateral peripheral zone at the base with focally reduced T2 signal (image 39 series 9) corresponding to mild focally reduced ADC map activity (image 15, series 7). This measures 0.30 cc (1.4 by 0.5 by 0.6 cm).   Volume: 3D volumetric analysis: Prostate volume 34.5 cc (4.9  by 4.0 by 4.1 cm).   Transcapsular spread: Absent   Seminal vesicle involvement: Absent   Neurovascular bundle involvement: Absent   Pelvic adenopathy: Absent   Bone metastasis: Absent   Other findings: No other significant findings.   IMPRESSION: 1. PI-RADS category 3 lesion of the left posterolateral peripheral zone at the base. Targeting data sent to [facility]. 2. Mild benign prostatic hypertrophy.   Electronically Signed   By: Gaylyn Rong M.D.   On: 11/28/2022 15:25 Personally reviewed the above scan and agree with radiologic interpretation.    Assessment & Plan:    1. History of low risk prostate cancer  - PSA stable. MRI reassuring. PI-RADS 3 lesion likely represents his known low risk prostate cancer. No evidence of aggressive disease. His genetic testing/ GPS score was fairly equivocal. Plan to continue active surveillance.  Return in about 6 months (around 06/25/2023) for DRE, PSA.  I have reviewed the above documentation for accuracy and completeness, and I agree with the above.   Vanna Scotland, MD   [facility] [address], [address] [place], [place] [address] [phone]

## 2023-02-26 ENCOUNTER — Encounter: Payer: Self-pay | Admitting: Dermatology

## 2023-02-26 ENCOUNTER — Ambulatory Visit: Payer: Medicare PPO | Admitting: Dermatology

## 2023-02-26 DIAGNOSIS — C44612 Basal cell carcinoma of skin of right upper limb, including shoulder: Secondary | ICD-10-CM | POA: Diagnosis not present

## 2023-02-26 DIAGNOSIS — Z86018 Personal history of other benign neoplasm: Secondary | ICD-10-CM

## 2023-02-26 DIAGNOSIS — D492 Neoplasm of unspecified behavior of bone, soft tissue, and skin: Secondary | ICD-10-CM

## 2023-02-26 DIAGNOSIS — Z7189 Other specified counseling: Secondary | ICD-10-CM

## 2023-02-26 DIAGNOSIS — W908XXA Exposure to other nonionizing radiation, initial encounter: Secondary | ICD-10-CM

## 2023-02-26 DIAGNOSIS — Z872 Personal history of diseases of the skin and subcutaneous tissue: Secondary | ICD-10-CM

## 2023-02-26 DIAGNOSIS — L814 Other melanin hyperpigmentation: Secondary | ICD-10-CM | POA: Diagnosis not present

## 2023-02-26 DIAGNOSIS — D485 Neoplasm of uncertain behavior of skin: Secondary | ICD-10-CM

## 2023-02-26 DIAGNOSIS — D1801 Hemangioma of skin and subcutaneous tissue: Secondary | ICD-10-CM

## 2023-02-26 DIAGNOSIS — D229 Melanocytic nevi, unspecified: Secondary | ICD-10-CM

## 2023-02-26 DIAGNOSIS — L821 Other seborrheic keratosis: Secondary | ICD-10-CM

## 2023-02-26 DIAGNOSIS — C4491 Basal cell carcinoma of skin, unspecified: Secondary | ICD-10-CM

## 2023-02-26 DIAGNOSIS — L820 Inflamed seborrheic keratosis: Secondary | ICD-10-CM | POA: Diagnosis not present

## 2023-02-26 DIAGNOSIS — Z1283 Encounter for screening for malignant neoplasm of skin: Secondary | ICD-10-CM

## 2023-02-26 DIAGNOSIS — D225 Melanocytic nevi of trunk: Secondary | ICD-10-CM

## 2023-02-26 DIAGNOSIS — L578 Other skin changes due to chronic exposure to nonionizing radiation: Secondary | ICD-10-CM

## 2023-02-26 HISTORY — DX: Basal cell carcinoma of skin, unspecified: C44.91

## 2023-02-26 MED ORDER — FLUOROURACIL 5 % EX CREA: TOPICAL_CREAM | CUTANEOUS | 0 refills | Status: DC

## 2023-02-26 NOTE — Progress Notes (Signed)
 Follow-Up Visit   Subjective  Juan Dunlap is a 68 y.o. male who presents for the following: Skin Cancer Screening and Full Body Skin Exam - hx of dysplastic nevus and Aks. Pt c/o irregular skin lesions above the L brown and R face.  The patient presents for Total-Body Skin Exam (TBSE) for skin cancer screening and mole check. The patient has spots, moles and lesions to be evaluated, some may be new or changing and the patient may have concern these could be cancer.  The following portions of the chart were reviewed this encounter and updated as appropriate: medications, allergies, medical history  Review of Systems:  No other skin or systemic complaints except as noted in HPI or Assessment and Plan.  Objective  Well appearing patient in no apparent distress; mood and affect are within normal limits.  A full examination was performed including scalp, head, eyes, ears, nose, lips, neck, chest, axillae, abdomen, back, buttocks, bilateral upper extremities, bilateral lower extremities, hands, feet, fingers, toes, fingernails, and toenails. All findings within normal limits unless otherwise noted below.   Relevant physical exam findings are noted in the Assessment and Plan.  L forearm x 1 Inflamed keratotic papule.  R upper arm 1.1 cm pink scaly thin plaque   Assessment & Plan   SKIN CANCER SCREENING PERFORMED TODAY.  ACTINIC DAMAGE WITH PRECANCEROUS ACTINIC KERATOSES Counseling for Topical Chemotherapy Management: Patient exhibits: - Severe, confluent actinic changes with pre-cancerous actinic keratoses that is secondary to cumulative UV radiation exposure over time - Condition that is severe; chronic, not at goal. - diffuse scaly erythematous macules and papules with underlying dyspigmentation - Discussed Prescription Field Treatment topical Chemotherapy for Severe, Chronic Confluent Actinic Changes with Pre-Cancerous Actinic Keratoses Field treatment involves treatment of an  entire area of skin that has confluent Actinic Changes (Sun/ Ultraviolet light damage) and PreCancerous Actinic Keratoses by method of PhotoDynamic Therapy (PDT) and/or prescription Topical Chemotherapy agents such as 5-fluorouracil , 5-fluorouracil /calcipotriene, and/or imiquimod.  The purpose is to decrease the number of clinically evident and subclinical PreCancerous lesions to prevent progression to development of skin cancer by chemically destroying early precancer changes that may or may not be visible.  It has been shown to reduce the risk of developing skin cancer in the treated area. As a result of treatment, redness, scaling, crusting, and open sores may occur during treatment course. One or more than one of these methods may be used and may have to be used several times to control, suppress and eliminate the PreCancerous changes. Discussed treatment course, expected reaction, and possible side effects. - Recommend daily broad spectrum sunscreen SPF 30+ to sun-exposed areas, reapply every 2 hours as needed.  - Staying in the shade or wearing long sleeves, sun glasses (UVA+UVB protection) and wide brim hats (4-inch brim around the entire circumference of the hat) are also recommended. - Call for new or changing lesions.  - Start 5FU/Calcipotriene cream BID to the cheeks, nose, and temples until irritation occurs, then stop.   LENTIGINES, SEBORRHEIC KERATOSES, HEMANGIOMAS - Benign normal skin lesions - Benign-appearing - Call for any changes  MELANOCYTIC NEVI - L med abdomen 1.2 x 1.0 cm brown macule. - L lat abdomen 1.6 x 1.0 cm brown macule. - Tan-brown and/or pink-flesh-colored symmetric macules and papules - Benign appearing on exam today - Observation - Call clinic for new or changing moles - Recommend daily use of broad spectrum spf 30+ sunscreen to sun-exposed areas.   HISTORY OF DYSPLASTIC NEVUS No evidence  of recurrence today Recommend regular full body skin exams Recommend  daily broad spectrum sunscreen SPF 30+ to sun-exposed areas, reapply every 2 hours as needed.  Call if any new or changing lesions are noted between office visits INFLAMED SEBORRHEIC KERATOSIS L forearm x 1 Start 5FU/Calcipotriene mix BID until irritation occurs then stop.  NEOPLASM OF UNCERTAIN BEHAVIOR OF SKIN R upper arm Skin / nail biopsy Type of biopsy: tangential   Informed consent: discussed and consent obtained   Timeout: patient name, date of birth, surgical site, and procedure verified   Procedure prep:  Patient was prepped and draped in usual sterile fashion Prep type:  Isopropyl alcohol Anesthesia: the lesion was anesthetized in a standard fashion   Anesthetic:  1% lidocaine  w/ epinephrine 1-100,000 buffered w/ 8.4% NaHCO3 Instrument used: DermaBlade   Hemostasis achieved with: pressure and aluminum chloride   Outcome: patient tolerated procedure well   Post-procedure details: sterile dressing applied and wound care instructions given   Dressing type: bandage and petrolatum   Specimen 1 - Surgical pathology Differential Diagnosis: ISK vs melanoma Check Margins: No MULTIPLE BENIGN NEVI   LENTIGINES   ACTINIC ELASTOSIS   SEBORRHEIC KERATOSES   CHERRY ANGIOMA   COUNSELING AND COORDINATION OF CARE    Return in about 1 year (around 02/26/2024) for TBSE - hx dysplastic nevus, AKs.  I, Rosina Mayans, CMA, am acting as scribe for Boneta Sharps, MD .   Documentation: I have reviewed the above documentation for accuracy and completeness, and I agree with the above.  Boneta Sharps, MD

## 2023-02-26 NOTE — Patient Instructions (Addendum)
 Instructions for [facility] Medications  One or more of your medications was sent to the [facility] mail order compounding pharmacy. You will receive an email from them and can purchase the medicine through that link. It will then be mailed to your home at the address you confirmed. If for any reason you do not receive an email from them, please check your spam folder. If you still do not find the email, please let us know. [facility] phone number is [phone].  5-Fluorouracil/Calcipotriene Patient Education   Actinic keratoses are the dry, red scaly spots on the skin caused by sun damage. A portion of these spots can turn into skin cancer with time, and treating them can help prevent development of skin cancer.   Treatment of these spots requires removal of the defective skin cells. There are various ways to remove actinic keratoses, including freezing with liquid nitrogen, treatment with creams, or treatment with a blue light procedure in the office.   5-fluorouracil cream is a topical cream used to treat actinic keratoses. It works by interfering with the growth of abnormal fast-growing skin cells, such as actinic keratoses. These cells peel off and are replaced by healthy ones. THIS CREAM SHOULD BE KEPT OUT OF REACH OF CHILDREN AND PETS AND SHOULD NOT BE USED BY PREGNANT WOMEN.  5-fluorouracil/calcipotriene is a combination of the 5-fluorouracil cream with a vitamin D analog cream called calcipotriene. The calcipotriene alone does not treat actinic keratoses. However, when it is combined with 5-fluorouracil, it helps the 5-fluorouracil treat the actinic keratoses much faster so that the same results can be achieved with a much shorter treatment time.  INSTRUCTIONS FOR 5-FLUOROURACIL/CALCIPOTRIENE CREAM:   5-fluorouracil/calcipotriene cream typically only needs to be used for 4-7 days. A thin layer should be applied twice a day to the treatment areas recommended by your  physician.   If your physician prescribed you separate tubes of 5-fluourouracil and calcipotriene, apply a thin layer of 5-fluorouracil followed by a thin layer of calcipotriene.   Avoid contact with your eyes or nostrils. Avoid applying the cream to your eyelids or lips unless directed to apply there by your physician. Do not use 5-fluorouracil/calcipotriene cream on infected or open wounds.   You will develop redness, irritation and some crusting at areas where you have pre-cancer damage/actinic keratoses. IF YOU DEVELOP PAIN, BLEEDING, OR SIGNIFICANT CRUSTING, STOP THE TREATMENT EARLY - you have already gotten a good response and the actinic keratoses should clear up well.  Wash your hands after applying 5-fluorouracil 5% cream on your skin.   A moisturizer or sunscreen with a minimum SPF 30 should be applied each morning.   Once you have finished the treatment, you can apply a thin layer of Vaseline twice a day to irritated areas to soothe and calm the areas more quickly. If you experience significant discomfort, contact your physician.  For some patients it is necessary to repeat the treatment for best results.  SIDE EFFECTS: When using 5-fluorouracil/calcipotriene cream, you may have mild irritation, such as redness, dryness, swelling, or a mild burning sensation. This usually resolves within 2 weeks. The more actinic keratoses you have, the more redness and inflammation you can expect during treatment. Eye irritation has been reported rarely. If this occurs, please let us know.   If you have any trouble using this cream, please send [place] a MyChart message or call the office. If you have any other questions about this information, please do not hesitate to ask me before  you leave the office or contact me on MyChart or by phone.     Wound Care Instructions  Cleanse wound gently with soap and water once a day then pat dry with clean gauze. Apply a thin coat of Petrolatum (petroleum jelly,  "Vaseline") over the wound (unless you have an allergy to this). We recommend that you use a new, sterile tube of Vaseline. Do not pick or remove scabs. Do not remove the yellow or white "healing tissue" from the base of the wound.  Cover the wound with fresh, clean, nonstick gauze and secure with paper tape. You may use Band-Aids in place of gauze and tape if the wound is small enough, but would recommend trimming much of the tape off as there is often too much. Sometimes Band-Aids can irritate the skin.  You should call the office for your biopsy report after 1 week if you have not already been contacted.  If you experience any problems, such as abnormal amounts of bleeding, swelling, significant bruising, significant pain, or evidence of infection, please call the office immediately.  FOR ADULT SURGERY PATIENTS: If you need something for pain relief you may take 1 extra strength Tylenol (acetaminophen) AND 2 Ibuprofen (200mg  each) together every 4 hours as needed for pain. (do not take these if you are allergic to them or if you have a reason you should not take them.) Typically, you may only need pain medication for 1 to 3 days.   Due to recent changes in healthcare laws, you may see results of your pathology and/or laboratory studies on MyChart before the doctors have had a chance to review them. We understand that in some cases there may be results that are confusing or concerning to you. Please understand that not all results are received at the same time and often the doctors may need to interpret multiple results in order to provide you with the best plan of care or course of treatment. Therefore, we ask that you please give [place] 2 business days to thoroughly review all your results before contacting the office for clarification. Should we see a critical lab result, you will be contacted sooner.   If You Need Anything After Your Visit  If you have any questions or concerns for your doctor,  please call our main line at [phone] and press option 4 to reach your doctor's medical assistant. If no one answers, please leave a voicemail as directed and we will return your call as soon as possible. Messages left after 4 pm will be answered the following business day.   You may also send [place] a message via MyChart. We typically respond to MyChart messages within 1-2 business days.  For prescription refills, please ask your pharmacy to contact our office. Our fax number is [phone].  If you have an urgent issue when the clinic is closed that cannot wait until the next business day, you can page your doctor at the number below.    Please note that while we do our best to be available for urgent issues outside of office hours, we are not available 24/7.   If you have an urgent issue and are unable to reach [place], you may choose to seek medical care at your doctor's office, retail clinic, urgent care center, or emergency room.  If you have a medical emergency, please immediately call 911 or go to the emergency department.  Pager Numbers  - Dr. Gwen Pounds: [phone]  - Dr. Roseanne Reno: [phone]  - Dr.  Katrinka Blazing: [phone]   In the event of inclement weather, please call our main line at [phone] for an update on the status of any delays or closures.  Dermatology Medication Tips: Please keep the boxes that topical medications come in in order to help keep track of the instructions about where and how to use these. Pharmacies typically print the medication instructions only on the boxes and not directly on the medication tubes.   If your medication is too expensive, please contact our office at [phone] option 4 or send [place] a message through MyChart.   We are unable to tell what your co-pay for medications will be in advance as this is different depending on your insurance coverage. However, we may be able to find a substitute medication at lower cost or fill out paperwork to  get insurance to cover a needed medication.   If a prior authorization is required to get your medication covered by your insurance company, please allow [place] 1-2 business days to complete this process.  Drug prices often vary depending on where the prescription is filled and some pharmacies may offer cheaper prices.  The website www.goodrx.com contains coupons for medications through different pharmacies. The prices here do not account for what the cost may be with help from insurance (it may be cheaper with your insurance), but the website can give you the price if you did not use any insurance.  - You can print the associated coupon and take it with your prescription to the pharmacy.  - You may also stop by our office during regular business hours and pick up a GoodRx coupon card.  - If you need your prescription sent electronically to a different pharmacy, notify our office through [facility] or by phone at [phone] option 4.     Si Usted Necesita Algo Despus de Su Visita  Tambin puede enviarnos un mensaje a travs de Clinical cytogeneticist. Por lo general respondemos a los mensajes de MyChart en el transcurso de 1 a 2 das hbiles.  Para renovar recetas, por favor pida a su farmacia que se ponga en contacto con nuestra oficina. Annie Sable de fax es [place] [phone].  Si tiene un asunto urgente cuando la clnica est cerrada y que no puede esperar hasta el siguiente da hbil, puede llamar/localizar a su doctor(a) al nmero que aparece a continuacin.   Por favor, tenga en cuenta que aunque hacemos todo lo posible para estar disponibles para asuntos urgentes fuera del horario de [place], no estamos disponibles las 24 horas del da, los 7 [address]  [address] de la [place].   Si tiene un problema urgente y no puede comunicarse con nosotros, puede optar por buscar atencin mdica  en el consultorio de su doctor(a), en una clnica privada, en un centro de atencin urgente o en una sala de emergencias.  Si  tiene Engineer, drilling, por favor llame inmediatamente al 911 o vaya a la sala de emergencias.  Nmeros de bper  - Dr. Gwen Pounds: [phone]  - Dra. Roseanne Reno: [phone]  - Dr. Katrinka Blazing: [phone]   En caso de inclemencias del tiempo, por favor llame a Lacy Duverney principal al [phone] para una actualizacin sobre el [place] de cualquier retraso o cierre.  Consejos para la medicacin en dermatologa: Por favor, guarde las cajas en las que vienen los medicamentos de uso tpico para ayudarle a seguir las instrucciones sobre dnde y cmo usarlos. Las farmacias generalmente imprimen las instrucciones del medicamento slo en las cajas y no directamente en los  tubos del medicamento.   Si su medicamento es muy caro, por favor, pngase en contacto con Rolm Gala llamando al [phone] y presione la opcin 4 o envenos un mensaje a travs de Clinical cytogeneticist.   No podemos decirle cul ser su copago por los medicamentos por adelantado ya que esto es diferente dependiendo de la cobertura de su seguro. Sin embargo, es posible que podamos encontrar un medicamento sustituto a Audiological scientist un formulario para que el seguro cubra el medicamento que se considera necesario.   Si se requiere una autorizacin previa para que su compaa de seguros [place] su medicamento, por favor permtanos de 1 a 2 das hbiles para completar [address]  Los precios de los medicamentos varan con frecuencia dependiendo del Environmental consultant de dnde se surte la receta y alguna farmacias pueden ofrecer precios ms baratos.  El sitio web www.goodrx.com tiene cupones para medicamentos de Health and safety inspector. Los precios aqu no tienen en cuenta lo que podra costar con la ayuda del seguro (puede ser ms barato con su seguro), pero el sitio web puede darle el precio si no utiliz Tourist information centre manager.  - Puede imprimir el cupn correspondiente y llevarlo con su receta a la farmacia.  - Tambin puede pasar por nuestra oficina  durante el horario de atencin regular y Education officer, museum una tarjeta de cupones de GoodRx.  - Si necesita que su receta se enve electrnicamente a una farmacia diferente, informe a nuestra oficina a travs de MyChart de [place] o por telfono llamando al [phone] y presione la opcin 4.

## 2023-02-28 ENCOUNTER — Ambulatory Visit: Payer: Medicare PPO | Admitting: Dermatology

## 2023-03-04 LAB — SURGICAL PATHOLOGY

## 2023-03-05 ENCOUNTER — Telehealth: Payer: Self-pay

## 2023-03-05 NOTE — Telephone Encounter (Signed)
-----   Message from [place] sent at 03/04/2023  7:15 PM EST ----- Diagnosis: R upper arm :       SUPERFICIAL BASAL CELL CARCINOMA, PINKUS TYPE (FIBROEPITHELIAL TUMOR OF PINKUS)    Please call to share diagnosis and discuss treatment options.  Explanation: your biopsy shows a basal cell skin cancer. This is the most common kind of skin cancer and is caused by damage from sun exposure. Basal cell skin cancers almost never spread beyond the skin, so they are not dangerous to your overall health. However, they will continue to grow, can bleed, cause nonhealing wounds, and disrupt nearby structures unless fully treated.   Treatment: you return for an hour long appointment where we perform a skin surgery. We numb the site of the skin cancer and a safety margin of normal skin around it. We remove the full thickness of skin and close the wound with two layers of stitches. The sample is sent to the lab to check that the skin cancer was fully removed. Return one week later to have wound checked and surface stitches removed. Surgical wound leaves a line scar. Approximately 95% cure rate

## 2023-03-11 ENCOUNTER — Telehealth: Payer: Self-pay

## 2023-03-11 NOTE — Telephone Encounter (Signed)
 Patient informed of pathology results and surgery scheduled.

## 2023-03-11 NOTE — Telephone Encounter (Signed)
-----   Message from [place] sent at 03/04/2023  7:15 PM EST ----- Diagnosis: R upper arm :       SUPERFICIAL BASAL CELL CARCINOMA, PINKUS TYPE (FIBROEPITHELIAL TUMOR OF PINKUS)    Please call to share diagnosis and discuss treatment options.  Explanation: your biopsy shows a basal cell skin cancer. This is the most common kind of skin cancer and is caused by damage from sun exposure. Basal cell skin cancers almost never spread beyond the skin, so they are not dangerous to your overall health. However, they will continue to grow, can bleed, cause nonhealing wounds, and disrupt nearby structures unless fully treated.   Treatment: you return for an hour long appointment where we perform a skin surgery. We numb the site of the skin cancer and a safety margin of normal skin around it. We remove the full thickness of skin and close the wound with two layers of stitches. The sample is sent to the lab to check that the skin cancer was fully removed. Return one week later to have wound checked and surface stitches removed. Surgical wound leaves a line scar. Approximately 95% cure rate

## 2023-04-17 ENCOUNTER — Ambulatory Visit: Payer: Medicare PPO | Admitting: Dermatology

## 2023-04-17 ENCOUNTER — Encounter: Payer: Self-pay | Admitting: Dermatology

## 2023-04-17 DIAGNOSIS — C44612 Basal cell carcinoma of skin of right upper limb, including shoulder: Secondary | ICD-10-CM

## 2023-04-17 MED ORDER — MUPIROCIN 2 % EX OINT: 1.0000 | TOPICAL_OINTMENT | Freq: Every day | CUTANEOUS | 0 refills | Status: DC

## 2023-04-17 NOTE — Progress Notes (Signed)
   Follow-Up Visit   Subjective  Juan Dunlap is a 68 y.o. male who presents for the following: Superficial BCC biopsy proven right upper arm, patient presents for excision.  The patient has spots, moles and lesions to be evaluated, some may be new or changing and the patient may have concern these could be cancer.   The following portions of the chart were reviewed this encounter and updated as appropriate: medications, allergies, medical history  Review of Systems:  No other skin or systemic complaints except as noted in HPI or Assessment and Plan.  Objective  Well appearing patient in no apparent distress; mood and affect are within normal limits.   A focused examination was performed of the following areas: right upper arm   Relevant exam findings are noted in the Assessment and Plan.  right upper arm Pink biopsy site  Assessment & Plan     BASAL CELL CARCINOMA (BCC) OF SKIN OF RIGHT UPPER EXTREMITY INCLUDING SHOULDER right upper arm Skin excision  Excision method:  elliptical Lesion length (cm):  1 Margin per side (cm):  0.4 Total excision diameter (cm):  1.8 Informed consent: discussed and consent obtained   Timeout: patient name, date of birth, surgical site, and procedure verified   Procedure prep:  Patient was prepped and draped in usual sterile fashion Prep type:  Chlorhexidine Anesthesia: the lesion was anesthetized in a standard fashion   Anesthetic:  1% lidocaine w/ epinephrine 1-100,000 buffered w/ 8.4% NaHCO3 (12 cc) Instrument used: #15 blade   Hemostasis achieved with: pressure   Outcome: patient tolerated procedure well with no complications   Additional details:  Tagged lateral  Skin repair Complexity:  Intermediate Final length (cm):  5.4 Informed consent: discussed and consent obtained   Timeout: patient name, date of birth, surgical site, and procedure verified   Procedure prep:  Patient was prepped and draped in usual sterile fashion Prep  type:  Chlorhexidine Anesthesia: the lesion was anesthetized in a standard fashion   Anesthetic:  1% lidocaine w/ epinephrine 1-100,000 buffered w/ 8.4% NaHCO3 Reason for type of repair: reduce tension to allow closure, reduce the risk of dehiscence, infection, and necrosis, reduce subcutaneous dead space and avoid a hematoma, allow closure of the large defect and preserve normal anatomy   Undermining: edges could be approximated without difficulty   Subcutaneous layers (deep stitches):  Suture size:  4-0 Suture type: Monocryl (poliglecaprone 25)   Stitches:  Buried vertical mattress Fine/surface layer approximation (top stitches):  Suture size:  5-0 Suture type: Prolene (polypropylene)   Stitches: simple running   Suture removal (days):  7 Hemostasis achieved with: suture, pressure and electrodesiccation Outcome: patient tolerated procedure well with no complications   Post-procedure details: sterile dressing applied and wound care instructions given   Dressing type: petrolatum, bandage and pressure dressing   Specimen 1 - Surgical pathology Differential Diagnosis: Biopsy proven BCC  Check Margins: yes  Pink biopsy site [id number] Tagged lateral Biopsy proven  Related Medications mupirocin ointment (BACTROBAN) 2 % Apply 1 Application topically daily.  Return in about 1 week (around 04/24/2023) for suture removal .  I, Angelique Holm, CMA, am acting as scribe for Elie Goody, MD .   Documentation: I have reviewed the above documentation for accuracy and completeness, and I agree with the above.  Elie Goody, MD

## 2023-04-17 NOTE — Patient Instructions (Signed)
 Wound Care Instructions  On the day following your surgery, you should begin doing daily dressing changes: Remove the old dressing and discard it. Cleanse the wound gently with tap water. This may be done in the shower or by placing a wet gauze pad directly on the wound and letting it soak for several minutes. It is important to gently remove any dried blood from the wound in order to encourage healing. This may be done by gently rolling a moistened Q-tip on the dried blood. Do not pick at the wound. If the wound should start to bleed, continue cleaning the wound, then place a moist gauze pad on the wound and hold pressure for a few minutes.  Make sure you then dry the skin surrounding the wound completely or the tape will not stick to the skin. Do not use cotton balls on the wound. After the wound is clean and dry, apply the ointment gently with a Q-tip. Cut a non-stick pad to fit the size of the wound. Lay the pad flush to the wound. If the wound is draining, you may want to reinforce it with a small amount of gauze on top of the non-stick pad for a little added compression to the area. Use the tape to seal the area completely. Select from the following with respect to your individual situation: If your wound has been stitched closed: continue the above steps 1-8 at least daily until your sutures are removed. If your wound has been left open to heal: continue steps 1-8 at least daily for the first 3-4 weeks. We would like for you to take a few extra precautions for at least the next week. Sleep with your head elevated on pillows if our wound is on your head. Do not bend over or lift heavy items to reduce the chance of elevated blood pressure to the wound Do not participate in particularly strenuous activities.   Below is a list of dressing supplies you might need.  Cotton-tipped applicators - Q-tips Gauze pads (2x2 and/or 4x4) - All-Purpose Sponges Non-stick dressing material - Telfa Tape -  Paper or Hypafix New and clean tube of petroleum jelly - Vaseline    Comments on Post-Operative Period Slight swelling and redness often appear around the wound. This is normal and will disappear within several days following the surgery. The healing wound will drain a brownish-red-yellow discharge during healing. This is a normal phase of wound healing. As the wound begins to heal, the drainage may increase in amount. Again, this drainage is normal. Notify us if the drainage becomes persistently bloody, excessively swollen, or intensely painful or develops a foul odor or red streaks.  If you should experience mild discomfort during the healing phase, you may take an aspirin-free medication such as Tylenol (acetaminophen). Notify us if the discomfort is severe or persistent. Avoid alcoholic beverages when taking pain medicine.  In Case of Wound Hemorrhage A wound hemorrhage is when the bandage suddenly becomes soaked with bright red blood and flows profusely. If this happens, sit down or lie down with your head elevated. If the wound has a dressing on it, do not remove the dressing. Apply pressure to the existing gauze. If the wound is not covered, use a gauze pad to apply pressure and continue applying the pressure for 20 minutes without peeking. DO NOT COVER THE WOUND WITH A LARGE TOWEL OR WASH CLOTH. Release your hand from the wound site but do not remove the dressing. If the bleeding has stopped,  gently clean around the wound. Leave the dressing in place for 24 hours if possible. This wait time allows the blood vessels to close off so that you do not spark a new round of bleeding by disrupting the newly clotted blood vessels with an immediate dressing change. If the bleeding does not subside, continue to hold pressure. If matters are out of your control, contact an After Hours clinic or go to the Emergency Room.

## 2023-04-18 LAB — SURGICAL PATHOLOGY

## 2023-04-22 ENCOUNTER — Telehealth: Payer: Self-pay

## 2023-04-22 NOTE — Telephone Encounter (Signed)
 Patient advised margins free. Said wound is doing fine. Butch Penny., RMA

## 2023-04-22 NOTE — Telephone Encounter (Signed)
-----   Message from [place] sent at 04/19/2023  6:37 PM EDT ----- Diagnosis right upper arm :       EXCISION, NO RESIDUAL BASAL CELL CARCINOMA, MARGINS FREE    Please call to share that excision was clear of skin cancer and get update on surgical wound. Thank you.

## 2023-04-24 ENCOUNTER — Ambulatory Visit (INDEPENDENT_AMBULATORY_CARE_PROVIDER_SITE_OTHER): Admitting: Dermatology

## 2023-04-24 ENCOUNTER — Encounter: Payer: Self-pay | Admitting: Dermatology

## 2023-04-24 DIAGNOSIS — Z5189 Encounter for other specified aftercare: Secondary | ICD-10-CM

## 2023-04-24 DIAGNOSIS — Z4802 Encounter for removal of sutures: Secondary | ICD-10-CM

## 2023-04-24 DIAGNOSIS — Z85828 Personal history of other malignant neoplasm of skin: Secondary | ICD-10-CM

## 2023-04-24 DIAGNOSIS — Z48817 Encounter for surgical aftercare following surgery on the skin and subcutaneous tissue: Secondary | ICD-10-CM

## 2023-04-24 NOTE — Progress Notes (Signed)
   Follow-Up Visit   Subjective  Juan Dunlap is a 68 y.o. male who presents for the following: Suture removal  Pathology showed no residual BCC, margins free  The following portions of the chart were reviewed this encounter and updated as appropriate: medications, allergies, medical history  Review of Systems:  No other skin or systemic complaints except as noted in HPI or Assessment and Plan.  Objective  Well appearing patient in no apparent distress; mood and affect are within normal limits.  Areas Examined: Right arm Relevant physical exam findings are noted in the Assessment and Plan.    Assessment & Plan   ENCOUNTER FOR REMOVAL OF SUTURES   VISIT FOR WOUND CHECK   Encounter for Removal of Sutures - Incision site is clean, dry and intact. - Wound cleansed, sutures removed, wound cleansed and steri strips applied.  - Discussed pathology results showing no residual BCC, margins free. - Patient advised to keep steri-strips dry until they fall off. - Scars remodel for a full year. - Once steri-strips fall off, patient can apply over-the-counter silicone scar cream once to twice a day to help with scar remodeling if desired. - Patient advised to call with any concerns or if they notice any new or changing lesions. Offered 6 month skin check. Patient prefers 1 year  Return for TBSE, with Dr. Kirtland Bouchard, as scheduled.  Anise Salvo, RMA, am acting as scribe for Elie Goody, MD .   Documentation: I have reviewed the above documentation for accuracy and completeness, and I agree with the above.  Elie Goody, MD

## 2023-04-24 NOTE — Patient Instructions (Signed)

## 2023-04-25 ENCOUNTER — Ambulatory Visit: Admitting: Dermatology

## 2023-05-14 ENCOUNTER — Other Ambulatory Visit: Payer: Medicare PPO | Admitting: Urology

## 2023-06-21 ENCOUNTER — Other Ambulatory Visit: Payer: Self-pay

## 2023-06-21 DIAGNOSIS — C61 Malignant neoplasm of prostate: Secondary | ICD-10-CM

## 2023-06-22 LAB — PSA: Prostate Specific Ag, Serum: 4 ng/mL (ref 0.0–4.0)

## 2023-06-25 ENCOUNTER — Ambulatory Visit: Payer: Self-pay | Admitting: Urology

## 2023-06-28 ENCOUNTER — Encounter: Payer: Self-pay | Admitting: Physician Assistant

## 2023-06-28 ENCOUNTER — Ambulatory Visit: Admitting: Physician Assistant

## 2023-06-28 VITALS — BP 144/78 | HR 54 | Ht 74.0 in | Wt 233.2 lb

## 2023-06-28 DIAGNOSIS — C61 Malignant neoplasm of prostate: Secondary | ICD-10-CM | POA: Diagnosis not present

## 2023-06-28 NOTE — Progress Notes (Signed)
 06/28/2023 3:32 PM   Juan Dunlap Nov 02, 1955 [id number]  CC: Chief Complaint  Patient presents with   Follow-up   HPI: Juan Dunlap is a 68 y.o. male with low risk prostate cancer on active surveillance who presents today for 43-month follow-up.   Today he reports no new voiding symptoms or bone pain.  No acute concerns today.  PSA last week was stable at 4.0.  PMH: Past Medical History:  Diagnosis Date   Actinic keratosis    Atrial flutter (HCC)    BCC (basal cell carcinoma of skin) 02/26/2023   right upper arm, excision 04/17/23   Dysplastic nevus 01/03/2021   Right back paraspinal - moderate   Gout 12/04/2018   Hyperlipidemia    Hypertension    borderline-pt states it is higher in md office but home checks are 140's/70's-PT CONTROLS BP WITH DIET AND EXERCISE PER PT   Kidney stones     Surgical History: Past Surgical History:  Procedure Laterality Date   CARDIOVERSION N/A 12/04/2018   Procedure: CARDIOVERSION;  Surgeon: Percival Brace, MD;  Location: [facility];  Service: Cardiovascular;  Laterality: N/A;   CARDIOVERSION N/A 02/23/2020   Procedure: CARDIOVERSION;  Surgeon: Percival Brace, MD;  Location: [facility];  Service: Cardiovascular;  Laterality: N/A;   COLONOSCOPY WITH PROPOFOL  N/A 10/23/2019   Procedure: COLONOSCOPY WITH PROPOFOL ;  Surgeon: Shane Darling, MD;  Location: [facility];  Service: Endoscopy;  Laterality: N/A;   CYSTOSCOPY W/ RETROGRADES Bilateral 09/07/2015   Procedure: CYSTOSCOPY WITH RETROGRADE PYELOGRAM;  Surgeon: Dustin Gimenez, MD;  Location: [facility];  Service: Urology;  Laterality: Bilateral;   CYSTOSCOPY WITH STENT PLACEMENT Left 09/07/2015   Procedure: CYSTOSCOPY WITH STENT PLACEMENT;  Surgeon: Dustin Gimenez, MD;  Location: [facility];  Service: Urology;  Laterality: Left;   URETEROSCOPY WITH HOLMIUM LASER LITHOTRIPSY Left 09/07/2015   Procedure: URETEROSCOPY WITH HOLMIUM LASER LITHOTRIPSY;  Surgeon: Dustin Gimenez, MD;   Location: [facility];  Service: Urology;  Laterality: Left;   WISDOM TOOTH EXTRACTION      Home Medications:  Allergies as of 06/28/2023   No Known Allergies      Medication List        Accurate as of June 28, 2023  3:32 PM. If you have any questions, ask your nurse or doctor.          COSAMIN DS PO Take 1 tablet by mouth daily.   diltiazem  120 MG 24 hr capsule Commonly known as: CARDIZEM  CD Take 120 mg by mouth daily.   flecainide  50 MG tablet Commonly known as: TAMBOCOR  Take 50 mg by mouth 2 (two) times daily.   fluorouracil  5 % cream Commonly known as: EFUDEX  Apply BID to the cheeks, nose, and temples until irritation occurs then stop.   MACULAR HEALTH FORMULA PO Take 1 tablet by mouth daily. Life Extension MacuGuard Ocular Support with Saffron & Astaxanthin   Milk Thistle 140 MG Caps Take 140 mg by mouth daily.   multivitamin with minerals Tabs tablet Take 1 tablet by mouth daily.   mupirocin  ointment 2 % Commonly known as: BACTROBAN  Apply 1 Application topically daily.   omeprazole 20 MG capsule Commonly known as: PRILOSEC Take by mouth.   rivaroxaban 20 MG Tabs tablet Commonly known as: XARELTO   rosuvastatin 10 MG tablet Commonly known as: CRESTOR Take 1 tablet by mouth daily.   Turmeric Curcumin 500 MG Caps Take 500 mg by mouth daily.   VITAMIN C PO Take 1 tablet by mouth daily.  Vitamin D3 50 MCG (2000 UT) capsule Take 2,000 Units by mouth daily.        Allergies:  No Known Allergies  Family History: Family History  Problem Relation Age of Onset   Prostate cancer Father    Hematuria Father    Kidney Stones Father    Hematuria Brother    Kidney Stones Brother    Kidney disease Neg Hx     Social History:   reports that he has never smoked. He has never used smokeless tobacco. He reports that he does not drink alcohol and does not use drugs.  Physical Exam: BP (!) 144/78   Pulse (!) 54   Ht 6\' 2"  (1.88 m)   Wt 233 lb 3.2  oz (105.8 kg)   BMI 29.94 kg/m   Constitutional:  Alert and oriented, no acute distress, nontoxic appearing HEENT: [place], AT Cardiovascular: No clubbing, cyanosis, or edema Respiratory: Normal respiratory effort, no increased work of breathing GU: Normal sphincter tone.  Smooth, symmetrically enlarged 30+ cc prostate without nodules or induration. Skin: No rashes, bruises or suspicious lesions Neurologic: Grossly intact, no focal deficits, moving all 4 extremities Psychiatric: Normal mood and affect  Laboratory Data: Results for orders placed or performed in visit on 06/21/23  PSA   Collection Time: 06/21/23  9:31 AM  Result Value Ref Range   Prostate Specific Ag, Serum 4.0 0.0 - 4.0 ng/mL   Assessment & Plan:   1. Prostate cancer (HCC) (Primary) PSA stable to slightly improved over prior, DRE benign.  Will continue close monitoring for his low risk disease.  Return in about 6 months (around 12/28/2023) for PSA prior and DRE.  Kathreen Pare, PA-C  [facility] [place] [address] [place], [place] [address] [phone]

## 2023-07-09 ENCOUNTER — Encounter: Payer: Self-pay | Admitting: Dermatology

## 2023-07-09 ENCOUNTER — Ambulatory Visit: Admitting: Dermatology

## 2023-07-09 DIAGNOSIS — W908XXA Exposure to other nonionizing radiation, initial encounter: Secondary | ICD-10-CM | POA: Diagnosis not present

## 2023-07-09 DIAGNOSIS — D1801 Hemangioma of skin and subcutaneous tissue: Secondary | ICD-10-CM

## 2023-07-09 DIAGNOSIS — Z1283 Encounter for screening for malignant neoplasm of skin: Secondary | ICD-10-CM | POA: Diagnosis not present

## 2023-07-09 DIAGNOSIS — L821 Other seborrheic keratosis: Secondary | ICD-10-CM

## 2023-07-09 DIAGNOSIS — Z85828 Personal history of other malignant neoplasm of skin: Secondary | ICD-10-CM

## 2023-07-09 DIAGNOSIS — L814 Other melanin hyperpigmentation: Secondary | ICD-10-CM

## 2023-07-09 DIAGNOSIS — L570 Actinic keratosis: Secondary | ICD-10-CM

## 2023-07-09 DIAGNOSIS — L578 Other skin changes due to chronic exposure to nonionizing radiation: Secondary | ICD-10-CM

## 2023-07-09 DIAGNOSIS — Z86018 Personal history of other benign neoplasm: Secondary | ICD-10-CM

## 2023-07-09 NOTE — Progress Notes (Signed)
 Follow-Up Visit   Subjective  Juan Dunlap is a 68 y.o. male who presents for the following: Skin Cancer Screening and Upper Body Skin Exam check spots face, one on R cheek has been there 5-6 wks, non healing, hx of bleeding, used 5FU/Calcipotriene on it  The patient presents for Upper Body Skin Exam (UBSE) for skin cancer screening and mole check. The patient has spots, moles and lesions to be evaluated, some may be new or changing and the patient may have concern these could be cancer.  The following portions of the chart were reviewed this encounter and updated as appropriate: medications, allergies, medical history  Review of Systems:  No other skin or systemic complaints except as noted in HPI or Assessment and Plan.  Objective  Well appearing patient in no apparent distress; mood and affect are within normal limits.  All skin waist up examined. Relevant physical exam findings are noted in the Assessment and Plan.  R zygoma ant to sup sideburn x 1, L mid dorsum nose x 1, R cheek infraorbital x 1 (3) Pink scaly macules  Assessment & Plan   AK (ACTINIC KERATOSIS) (3) R zygoma ant to sup sideburn x 1, L mid dorsum nose x 1, R cheek infraorbital x 1 (3) In 2 months if R zygoma ant to sup sideburn still present call clinic for re-evalutaion  Actinic keratoses are precancerous spots that appear secondary to cumulative UV radiation exposure/sun exposure over time. They are chronic with expected duration over 1 year. A portion of actinic keratoses will progress to squamous cell carcinoma of the skin. It is not possible to reliably predict which spots will progress to skin cancer and so treatment is recommended to prevent development of skin cancer.  Recommend daily broad spectrum sunscreen SPF 30+ to sun-exposed areas, reapply every 2 hours as needed.  Recommend staying in the shade or wearing long sleeves, sun glasses (UVA+UVB protection) and wide brim hats (4-inch brim around the  entire circumference of the hat). Call for new or changing lesions. Destruction of lesion - R zygoma ant to sup sideburn x 1, L mid dorsum nose x 1, R cheek infraorbital x 1 (3) Complexity: simple   Destruction method: cryotherapy   Informed consent: discussed and consent obtained   Timeout:  patient name, date of birth, surgical site, and procedure verified Lesion destroyed using liquid nitrogen: Yes   Region frozen until ice ball extended beyond lesion: Yes   Outcome: patient tolerated procedure well with no complications   Post-procedure details: wound care instructions given   Skin cancer screening performed today.  Actinic Damage - Chronic condition, secondary to cumulative UV/sun exposure - diffuse scaly erythematous macules with underlying dyspigmentation - Recommend daily broad spectrum sunscreen SPF 30+ to sun-exposed areas, reapply every 2 hours as needed.  - Staying in the shade or wearing long sleeves, sun glasses (UVA+UVB protection) and wide brim hats (4-inch brim around the entire circumference of the hat) are also recommended for sun protection.  - Call for new or changing lesions.  Lentigines, Seborrheic Keratoses, Hemangiomas - Benign normal skin lesions - Benign-appearing - Call for any changes  Melanocytic Nevi - Tan-brown and/or pink-flesh-colored symmetric macules and papules - Benign appearing on exam today - Observation - Call clinic for new or changing moles - Recommend daily use of broad spectrum spf 30+ sunscreen to sun-exposed areas.   HISTORY OF BASAL CELL CARCINOMA OF THE SKIN - No evidence of recurrence today - Recommend regular full body  skin exams - Recommend daily broad spectrum sunscreen SPF 30+ to sun-exposed areas, reapply every 2 hours as needed.  - Call if any new or changing lesions are noted between office visits  - R upper arm  HISTORY OF DYSPLASTIC NEVUS No evidence of recurrence today Recommend regular full body skin exams Recommend  daily broad spectrum sunscreen SPF 30+ to sun-exposed areas, reapply every 2 hours as needed.  Call if any new or changing lesions are noted between office visits  - R back paraspinal  Return for as scheduled for TBSE.  I, Rollie Clipper, RMA, am acting as scribe for Celine Collard, MD .   Documentation: I have reviewed the above documentation for accuracy and completeness, and I agree with the above.  Celine Collard, MD

## 2023-07-09 NOTE — Patient Instructions (Addendum)
 In 2 months if spot on right zygoma anterior to superior sideburn still present call clinic for re-evalutaion  Cryotherapy Aftercare  Wash gently with soap and water everyday.   Apply Vaseline and Band-Aid daily until healed.   Due to recent changes in healthcare laws, you may see results of your pathology and/or laboratory studies on MyChart before the doctors have had a chance to review them. We understand that in some cases there may be results that are confusing or concerning to you. Please understand that not all results are received at the same time and often the doctors may need to interpret multiple results in order to provide you with the best plan of care or course of treatment. Therefore, we ask that you please give us  2 business days to thoroughly review all your results before contacting the office for clarification. Should we see a critical lab result, you will be contacted sooner.   If You Need Anything After Your Visit  If you have any questions or concerns for your doctor, please call our main line at [phone] and press option 4 to reach your doctor's medical assistant. If no one answers, please leave a voicemail as directed and we will return your call as soon as possible. Messages left after 4 pm will be answered the following business day.   You may also send us  a message via MyChart. We typically respond to MyChart messages within 1-2 business days.  For prescription refills, please ask your pharmacy to contact our office. Our fax number is [phone].  If you have an urgent issue when the clinic is closed that cannot wait until the next business day, you can page your doctor at the number below.    Please note that while we do our best to be available for urgent issues outside of office hours, we are not available 24/7.   If you have an urgent issue and are unable to reach us , you may choose to seek medical care at your doctor's office, retail clinic, urgent care  center, or emergency room.  If you have a medical emergency, please immediately call 911 or go to the emergency department.  Pager Numbers  - Dr. Bary Likes: [phone]  - Dr. Annette Barters: [phone]  - Dr. Felipe Horton: [phone]   In the event of inclement weather, please call our main line at [phone] for an update on the status of any delays or closures.  Dermatology Medication Tips: Please keep the boxes that topical medications come in in order to help keep track of the instructions about where and how to use these. Pharmacies typically print the medication instructions only on the boxes and not directly on the medication tubes.   If your medication is too expensive, please contact our office at [phone] option 4 or send us  a message through MyChart.   We are unable to tell what your co-pay for medications will be in advance as this is different depending on your insurance coverage. However, we may be able to find a substitute medication at lower cost or fill out paperwork to get insurance to cover a needed medication.   If a prior authorization is required to get your medication covered by your insurance company, please allow us  1-2 business days to complete this process.  Drug prices often vary depending on where the prescription is filled and some pharmacies may offer cheaper prices.  The website www.goodrx.com contains coupons for medications through different pharmacies. The prices here do not account for what the  cost may be with help from insurance (it may be cheaper with your insurance), but the website can give you the price if you did not use any insurance.  - You can print the associated coupon and take it with your prescription to the pharmacy.  - You may also stop by our office during regular business hours and pick up a GoodRx coupon card.  - If you need your prescription sent electronically to a different pharmacy, notify our office through [facility] or by  phone at [phone] option 4.     Si Usted Necesita Algo Despus de Su Visita  Tambin puede enviarnos un mensaje a travs de Clinical cytogeneticist. Por lo general respondemos a los mensajes de MyChart en el transcurso de 1 a 2 das hbiles.  Para renovar recetas, por favor pida a su farmacia que se ponga en contacto con nuestra oficina. Franz Jacks de fax es [place] [phone].  Si tiene un asunto urgente cuando la clnica est cerrada y que no puede esperar hasta el siguiente da hbil, puede llamar/localizar a su doctor(a) al nmero que aparece a continuacin.   Por favor, tenga en cuenta que aunque hacemos todo lo posible para estar disponibles para asuntos urgentes fuera del horario de [place], no estamos disponibles las 24 horas del da, los 7 [address]  [address] de la [place].   Si tiene un problema urgente y no puede comunicarse con nosotros, puede optar por buscar atencin mdica  en el consultorio de su doctor(a), en una clnica privada, en un centro de atencin urgente o en una sala de emergencias.  Si tiene Engineer, drilling, por favor llame inmediatamente al 911 o vaya a la sala de emergencias.  Nmeros de bper  - Dr. Bary Likes: [phone]  - Dra. Annette Barters: [phone]  - Dr. Felipe Horton: [phone]   En caso de inclemencias del tiempo, por favor llame a Lajuan Pila principal al [phone] para una actualizacin sobre el [place] de cualquier retraso o cierre.  Consejos para la medicacin en dermatologa: Por favor, guarde las cajas en las que vienen los medicamentos de uso tpico para ayudarle a seguir las instrucciones sobre dnde y cmo usarlos. Las farmacias generalmente imprimen las instrucciones del medicamento slo en las cajas y no directamente en los tubos del [place].   Si su medicamento es muy caro, por favor, pngase en contacto con Bettyjane Brunet llamando al [phone] y presione la opcin 4 o envenos un mensaje a travs de Clinical cytogeneticist.   No podemos decirle cul ser su copago  por los medicamentos por adelantado ya que esto es diferente dependiendo de la cobertura de su seguro. Sin embargo, es posible que podamos encontrar un medicamento sustituto a Audiological scientist un formulario para que el seguro cubra el medicamento que se considera necesario.   Si se requiere una autorizacin previa para que su compaa de seguros [place] su medicamento, por favor permtanos de 1 a 2 das hbiles para completar este proceso.  Los precios de los medicamentos varan con frecuencia dependiendo del Environmental consultant de dnde se surte la receta y alguna farmacias pueden ofrecer precios ms baratos.  El sitio web www.goodrx.com tiene cupones para medicamentos de Health and safety inspector. Los precios aqu no tienen en cuenta lo que podra costar con la ayuda del seguro (puede ser ms barato con su seguro), pero el sitio web puede darle el precio si no utiliz Tourist information centre manager.  - Puede imprimir el cupn correspondiente y llevarlo con su receta a la farmacia.  - Tambin puede pasar  por nuestra oficina durante el horario de atencin regular y Education officer, museum una tarjeta de cupones de GoodRx.  - Si necesita que su receta se enve electrnicamente a una farmacia diferente, informe a nuestra oficina a travs de MyChart de [place] o por telfono llamando al [phone] y presione la opcin 4.

## 2023-12-24 NOTE — Patient Instructions (Signed)
 Thank you for choosing [facility] as part of your heart care. Please report any symptoms through the nurse triage line so they may be addressed promptly. Please read below for additional information regarding your care.  Contact Information for [facility]  Nurse Triage Line: [phone], option 3,  Monday-Friday 8:00am-4:30PM. For patients seen in [place] with Drs. Franky Mace, Alyce File, and Toribio Li please call [phone].  Please call to report symptoms or if you have any questions or concerns prior to your next appointment. Please have the following information available when calling: patient's name, date of birth or medical record number and provider's name.   Paging Operator: [phone]  If you have an urgent issue after 5pm, during the weekend, or on a holiday, please call this number and ask the operator to page the Cardiac Electrophysiologist on call. If symptoms are severe, please call 911.   MyChart Messages Please sign up for a [facility] MyChart account if you do not have one. For your safety and best care, please DO NOT use MyChart messages to report symptoms unless asked to do so by your providers. Please use MyChart for non-urgent matters such as general questions, non-urgent prescription refills, or non-urgent scheduling issues.   Please know that while you may send MyChart messages at any time, messages are only checked Monday-Friday 8:00am-4:30PM. For urgent issues after hours please refer to paging operator instructions above. Please note that MyChart messages are routed to a central pool and one of your provider's team members will get back to you. You may be asked to make an appointment to address your MyChart message and concerns for your safety. Expect up to 3 business days for response. If this timeframe is not satisfactory, please call Nurse triage at [phone]. If you have any questions about [facility] or  are locked out of your account, please call [facility] Customer Service at [phone] or [phone] between 8:00am-5:00pm ET Monday, Tuesday, Wednesday and Friday or 8:00am-4:00pm ET Thursday.  Appointment Hub: [phone], option 2, Monday-Friday 8:00am-4:30pm If you have questions or concerns regarding your appointments or need to make changes to your appointments, please contact the appointment hub via phone or request an appt via your [facility] MyChart.   Lab and test results: During your visit, your provider may order lab work, radiology exams or other tests. These results may take up to 7-10 business days and may be communicated to you by our office in the following ways:  Please sign up for a [facility] MyChart account so that we can send your results via a private message to your MyChart account. If you do not use your MyChart account, it is important to let our clinical staff know.   You may receive a letter in the mail with results.  If you are scheduled for a follow up appointment soon after your tests are completed, your provider may wish to wait and discuss them during the follow up appointment. If you had an ECG performed, it will be reviewed by your provider at the time of your visit. If you have a cardiac device (pacemaker, defibrillator, loop recorder), you will only be contacted for missed or abnormal results pertaining to your remote transmission.    Follow up appointments: As part of your continued care with [facility], you will also be scheduled with an Advanced Practice Provider (APP). Our APPs are nurse practitioners/physician assistants who are trained specifically to care for patients with  rhythm disorders and cardiac devices. They will work closely with your physician/nurse team to provide comprehensive, patient centered care. Our current team of APPs include: Alisa Like, NP Ashley Keepers, NP Lenn Saba, NP Earnie Clay, NP Mylinda Kunkle, NP Delon Kerns,  PA Ester Pinal, NP Jeoffrey Ano, NP Therisa Bihari, NP Rojean Kobus, NP  Please visit [email or web address] to learn more about our providers.  Arrival time, late arrivals and no show policies: Please arrive 30 minutes prior to your appointment time to have sufficient time for front desk registration and to have a comprehensive nursing assessment and check in. Please bring the following items to your visit: medication list and pill bottles, any blood pressure or daily weight logs your provider has requested that you keep. Patients who arrive 15 minutes or more late to their appointment may be asked to reschedule. If you are unable to keep your scheduled appointment, we kindly request that you call more than 24 hours or more in advance to reschedule. Failure to provide 24-hour advance notice may result in your inability to schedule future appointments or choose your reschedule date.  Insurance / FMLA Paperwork: Please allow for a 2 week turnaround time for all paperwork submitted.   Thank you,  [facility] [phone]

## 2023-12-27 ENCOUNTER — Encounter: Payer: Self-pay | Admitting: Urology

## 2023-12-27 ENCOUNTER — Ambulatory Visit: Admitting: Urology

## 2023-12-27 VITALS — BP 167/89 | HR 81 | Ht 74.0 in | Wt 221.0 lb

## 2023-12-27 DIAGNOSIS — C61 Malignant neoplasm of prostate: Secondary | ICD-10-CM | POA: Diagnosis not present

## 2023-12-27 NOTE — Progress Notes (Signed)
 12/27/2023 11:56 AM   Juan Dunlap 1955-08-16 [id number]  Referring provider: Valora Juan FALCON, MD [address] [place],  [place] [address]  Chief Complaint  Patient presents with   Prostate Cancer   Urologic history: 1.  Prostate cancer Prostate biopsy 06/12/2022: PSA 4.92, repeat 4.47; prostate volume 48 cc Pathology: Gleason 3+3, LLB, LLM, LLA, RLA (14%, 5%, 25%, 3%); LA core with focal ASAP Elected active surveillance MRI 11/28/2022 with PI-RADS 3 lesion left posterolateral PZ; volume 36 cc  2.  Recurrent nephrolithiasis Left ureteroscopy/laser lithotripsy 10 mm left midpole calculus Prior history of passed ureteral calculi   HPI: Juan Dunlap is a 68 y.o. male presents for prostate cancer follow-up.  No complaints since last visit No bothersome LUTS Last PSA 06/21/2023 stable at 4.0   PMH: Past Medical History:  Diagnosis Date   Actinic keratosis    Atrial flutter (HCC)    BCC (basal cell carcinoma of skin) 02/26/2023   right upper arm, excision 04/17/23   Dysplastic nevus 01/03/2021   Right back paraspinal - moderate   Gout 12/04/2018   Hyperlipidemia    Hypertension    borderline-pt states it is higher in md office but home checks are 140's/70's-PT CONTROLS BP WITH DIET AND EXERCISE PER PT   Kidney stones     Surgical History: Past Surgical History:  Procedure Laterality Date   CARDIOVERSION N/A 12/04/2018   Procedure: CARDIOVERSION;  Surgeon: Ammon Blunt, MD;  Location: [facility];  Service: Cardiovascular;  Laterality: N/A;   CARDIOVERSION N/A 02/23/2020   Procedure: CARDIOVERSION;  Surgeon: Ammon Blunt, MD;  Location: [facility];  Service: Cardiovascular;  Laterality: N/A;   COLONOSCOPY WITH PROPOFOL  N/A 10/23/2019   Procedure: COLONOSCOPY WITH PROPOFOL ;  Surgeon: Maryruth Ole DASEN, MD;  Location: [facility];  Service: Endoscopy;  Laterality: N/A;   CYSTOSCOPY W/ RETROGRADES Bilateral 09/07/2015   Procedure:  CYSTOSCOPY WITH RETROGRADE PYELOGRAM;  Surgeon: Rosina Riis, MD;  Location: [facility];  Service: Urology;  Laterality: Bilateral;   CYSTOSCOPY WITH STENT PLACEMENT Left 09/07/2015   Procedure: CYSTOSCOPY WITH STENT PLACEMENT;  Surgeon: Rosina Riis, MD;  Location: [facility];  Service: Urology;  Laterality: Left;   URETEROSCOPY WITH HOLMIUM LASER LITHOTRIPSY Left 09/07/2015   Procedure: URETEROSCOPY WITH HOLMIUM LASER LITHOTRIPSY;  Surgeon: Rosina Riis, MD;  Location: [facility];  Service: Urology;  Laterality: Left;   WISDOM TOOTH EXTRACTION      Home Medications:  Allergies as of 12/27/2023   No Known Allergies      Medication List        Accurate as of December 27, 2023 11:56 AM. If you have any questions, ask your nurse or doctor.          STOP taking these medications    diltiazem  120 MG 24 hr capsule Commonly known as: CARDIZEM  CD Stopped by: Glendia JAYSON Barba   flecainide  50 MG tablet Commonly known as: TAMBOCOR  Stopped by: Glendia JAYSON Barba   fluorouracil  5 % cream Commonly known as: EFUDEX  Stopped by: Glendia JAYSON Barba   mupirocin  ointment 2 % Commonly known as: BACTROBAN  Stopped by: Glendia JAYSON Barba   omeprazole 20 MG capsule Commonly known as: PRILOSEC Stopped by: Glendia JAYSON Barba       TAKE these medications    amiodarone 200 MG tablet Commonly known as: PACERONE Take 100 mg by mouth.   amLODipine 5 MG tablet Commonly known as: NORVASC Take 5 mg by mouth.   COSAMIN DS PO Take 1  tablet by mouth daily.   lisinopril 20 MG tablet Commonly known as: ZESTRIL Take 20 mg by mouth.   MACULAR HEALTH FORMULA PO Take 1 tablet by mouth daily. Life Extension MacuGuard Ocular Support with Saffron & Astaxanthin   Milk Thistle 140 MG Caps Take 140 mg by mouth daily.   multivitamin with minerals Tabs tablet Take 1 tablet by mouth daily.   rivaroxaban 20 MG Tabs tablet Commonly known as: XARELTO   rosuvastatin 10 MG tablet Commonly known as:  CRESTOR Take 1 tablet by mouth daily.   Turmeric Curcumin 500 MG Caps Take 500 mg by mouth daily.   VITAMIN C PO Take 1 tablet by mouth daily.   Vitamin D3 50 MCG (2000 UT) capsule Take 2,000 Units by mouth daily.        Allergies: No Known Allergies  Family History: Family History  Problem Relation Age of Onset   Prostate cancer Father    Hematuria Father    Kidney Stones Father    Hematuria Brother    Kidney Stones Brother    Kidney disease Neg Hx     Social History:  reports that he has never smoked. He has never used smokeless tobacco. He reports that he does not drink alcohol and does not use drugs.   Physical Exam: BP (!) 167/89   Pulse 81   Ht 6' 2 (1.88 m)   Wt 221 lb (100.2 kg)   BMI 28.37 kg/m   Constitutional:  Alert, No acute distress. HEENT: [place] AT Respiratory: Normal respiratory effort, no increased work of breathing. GU: DRE June 2025 visit was benign Psychiatric: Normal mood and affect.   Assessment & Plan:    1.  Prostate cancer T1c low risk prostate cancer PSA drawn today 64-month follow-up with PSA/DRE We discussed recommendations of a confirmatory biopsy within 1-2 years of his initial biopsy.  Will discuss further on follow-up   Glendia JAYSON Barba, MD  [facility] [address], [address] [place], [place] [address] [phone]

## 2023-12-28 LAB — PSA: Prostate Specific Ag, Serum: 5.8 ng/mL — ABNORMAL HIGH (ref 0.0–4.0)

## 2024-01-03 ENCOUNTER — Ambulatory Visit: Payer: Self-pay | Admitting: Urology

## 2024-02-27 ENCOUNTER — Encounter: Payer: Self-pay | Admitting: Dermatology

## 2024-02-27 ENCOUNTER — Ambulatory Visit: Payer: Medicare PPO | Admitting: Dermatology

## 2024-02-27 DIAGNOSIS — Z7189 Other specified counseling: Secondary | ICD-10-CM

## 2024-02-27 DIAGNOSIS — L820 Inflamed seborrheic keratosis: Secondary | ICD-10-CM | POA: Diagnosis not present

## 2024-02-27 DIAGNOSIS — Z86018 Personal history of other benign neoplasm: Secondary | ICD-10-CM

## 2024-02-27 DIAGNOSIS — Z872 Personal history of diseases of the skin and subcutaneous tissue: Secondary | ICD-10-CM | POA: Diagnosis not present

## 2024-02-27 DIAGNOSIS — L821 Other seborrheic keratosis: Secondary | ICD-10-CM

## 2024-02-27 DIAGNOSIS — D229 Melanocytic nevi, unspecified: Secondary | ICD-10-CM

## 2024-02-27 DIAGNOSIS — W908XXA Exposure to other nonionizing radiation, initial encounter: Secondary | ICD-10-CM | POA: Diagnosis not present

## 2024-02-27 DIAGNOSIS — Z85828 Personal history of other malignant neoplasm of skin: Secondary | ICD-10-CM

## 2024-02-27 DIAGNOSIS — Z1283 Encounter for screening for malignant neoplasm of skin: Secondary | ICD-10-CM | POA: Diagnosis not present

## 2024-02-27 DIAGNOSIS — L578 Other skin changes due to chronic exposure to nonionizing radiation: Secondary | ICD-10-CM

## 2024-02-27 DIAGNOSIS — L570 Actinic keratosis: Secondary | ICD-10-CM

## 2024-02-27 DIAGNOSIS — Q828 Other specified congenital malformations of skin: Secondary | ICD-10-CM

## 2024-02-27 DIAGNOSIS — L814 Other melanin hyperpigmentation: Secondary | ICD-10-CM | POA: Diagnosis not present

## 2024-02-27 DIAGNOSIS — D225 Melanocytic nevi of trunk: Secondary | ICD-10-CM | POA: Diagnosis not present

## 2024-02-27 NOTE — Progress Notes (Signed)
 "  Follow-Up Visit   Subjective  Juan Dunlap is a 69 y.o. male who presents for the following: Skin Cancer Screening and Full Body Skin Exam Hx of bcc Hx of dysplastic nevi  Hx of aks Hx of porokeratosis   Right upper lip see photo isk Red spot at right upper lip he noticed 2 to 3 months ago Recheck ak at  R zygoma ant to sup sideburn   The patient presents for Total-Body Skin Exam (TBSE) for skin cancer screening and mole check. The patient has spots, moles and lesions to be evaluated, some may be new or changing and the patient may have concern these could be cancer.  The following portions of the chart were reviewed this encounter and updated as appropriate: medications, allergies, medical history  Review of Systems:  No other skin or systemic complaints except as noted in HPI or Assessment and Plan.  Objective  Well appearing patient in no apparent distress; mood and affect are within normal limits.  A full examination was performed including scalp, head, eyes, ears, nose, lips, neck, chest, axillae, abdomen, back, buttocks, bilateral upper extremities, bilateral lower extremities, hands, feet, fingers, toes, fingernails, and toenails. All findings within normal limits unless otherwise noted below.   Relevant physical exam findings are noted in the Assessment and Plan.  Sk at right upper lip        Melanocytic nevi at left abdomen x 2       Lupper q abdomen medial 1.1 cm brown macule  L upp q ab lateral 1.3 cm brown macule photos  face x 4 (4) Erythematous thin papules/macules with gritty scale.  Right Upper lip vermillion x 1 Erythematous stuck-on, waxy papule or plaque See photos    Assessment & Plan   HISTORY OF BASAL CELL CARCINOMA OF THE SKIN 02/26/2023 right upper arm (tricep) - excision 04/17/2023 Clear today  - No evidence of recurrence today - Recommend regular full body skin exams - Recommend daily broad spectrum sunscreen SPF 30+ to sun-exposed  areas, reapply every 2 hours as needed.  - Call if any new or changing lesions are noted between office visits  HISTORY OF DYSPLASTIC NEVUS 01/03/2021 right back paraspinal moderate  No evidence of recurrence today Recommend regular full body skin exams Recommend daily broad spectrum sunscreen SPF 30+ to sun-exposed areas, reapply every 2 hours as needed.  Call if any new or changing lesions are noted between office visits   SKIN CANCER SCREENING PERFORMED TODAY.  ACTINIC DAMAGE - Chronic condition, secondary to cumulative UV/sun exposure - diffuse scaly erythematous macules with underlying dyspigmentation - Recommend daily broad spectrum sunscreen SPF 30+ to sun-exposed areas, reapply every 2 hours as needed.  - Staying in the shade or wearing long sleeves, sun glasses (UVA+UVB protection) and wide brim hats (4-inch brim around the entire circumference of the hat) are also recommended for sun protection.  - Call for new or changing lesions.  LENTIGINES, SEBORRHEIC KERATOSES, HEMANGIOMAS - Benign normal skin lesions - Benign-appearing - Call for any changes  MELANOCYTIC NEVI LUQA medial 1.1 cm brown macule see todays photos  LUQA lateral 1.3 cm brown macule see todays photos  Stable at todays exam Also refer to previous photos from 01/03/2021 - Tan-brown and/or pink-flesh-colored symmetric macules and papules - Benign appearing on exam today - Observation - Call clinic for new or changing moles - Recommend daily use of broad spectrum spf 30+ sunscreen to sun-exposed areas.    Porokeratosis L forearm Exam: 2.5  cm pink oval patch with raise rim Benign-appearing.  Observation.  Call clinic for new or changing lesions.  Recommend daily use of broad spectrum spf 30+ sunscreen to sun-exposed areas.   HISTORY OF PRECANCEROUS ACTINIC KERATOSIS Right sideburn anterior to superior sideburn  Clear today  - site(s) of PreCancerous Actinic Keratosis clear today. - these may recur and  new lesions may form requiring treatment to prevent transformation into skin cancer - observe for new or changing spots and contact [facility] for appointment if occur - photoprotection with sun protective clothing; sunglasses and broad spectrum sunscreen with SPF of at least 30 + and frequent self skin exams recommended - yearly exams by a dermatologist recommended for persons with history of PreCancerous Actinic Keratoses  ACTINIC KERATOSIS (4) face x 4 (4) Actinic keratoses are precancerous spots that appear secondary to cumulative UV radiation exposure/sun exposure over time. They are chronic with expected duration over 1 year. A portion of actinic keratoses will progress to squamous cell carcinoma of the skin. It is not possible to reliably predict which spots will progress to skin cancer and so treatment is recommended to prevent development of skin cancer.  Recommend daily broad spectrum sunscreen SPF 30+ to sun-exposed areas, reapply every 2 hours as needed.  Recommend staying in the shade or wearing long sleeves, sun glasses (UVA+UVB protection) and wide brim hats (4-inch brim around the entire circumference of the hat). Call for new or changing lesions. - Destruction of lesion - face x 4 (4) Complexity: simple   Destruction method: cryotherapy   Informed consent: discussed and consent obtained   Timeout:  patient name, date of birth, surgical site, and procedure verified Lesion destroyed using liquid nitrogen: Yes   Region frozen until ice ball extended beyond lesion: Yes   Outcome: patient tolerated procedure well with no complications   Post-procedure details: wound care instructions given    INFLAMED SEBORRHEIC KERATOSIS Right Upper lip vermillion x 1 Symptomatic, irritating, patient would like treated. Examined under dermatoscope at right upper lip = Benign features.  Discussed will treat with Ln2 today will continue to watch, patient advised if not resolved after 2  months to send mychart or call to be re-checked Will recheck at next follow up - Destruction of lesion - Right Upper lip vermillion x 1 Complexity: simple   Destruction method: cryotherapy   Informed consent: discussed and consent obtained   Timeout:  patient name, date of birth, surgical site, and procedure verified Lesion destroyed using liquid nitrogen: Yes   Region frozen until ice ball extended beyond lesion: Yes   Outcome: patient tolerated procedure well with no complications   Post-procedure details: wound care instructions given    Return in about 6 months (around 08/26/2024) for tbse ak follow up.  IEleanor Blush, CMA, am acting as scribe for Alm Rhyme, MD.   Documentation: I have reviewed the above documentation for accuracy and completeness, and I agree with the above.  Alm Rhyme, MD    "

## 2024-02-27 NOTE — Patient Instructions (Addendum)
 If you see area persist after 2 month call or send mychart message  At right upper lip    Melanoma ABCDEs  Melanoma is the most dangerous type of skin cancer, and is the leading cause of death from skin disease.  You are more likely to develop melanoma if you: Have light-colored skin, light-colored eyes, or red or blond hair Spend a lot of time in the sun Tan regularly, either outdoors or in a tanning bed Have had blistering sunburns, especially during childhood Have a close family member who has had a melanoma Have atypical moles or large birthmarks  Early detection of melanoma is key since treatment is typically straightforward and cure rates are extremely high if we catch it early.   The first sign of melanoma is often a change in a mole or a new dark spot.  The ABCDE system is a way of remembering the signs of melanoma.  A for asymmetry:  The two halves do not match. B for border:  The edges of the growth are irregular. C for color:  A mixture of colors are present instead of an even brown color. D for diameter:  Melanomas are usually (but not always) greater than 6mm - the size of a pencil eraser. E for evolution:  The spot keeps changing in size, shape, and color.  Please check your skin once per month between visits. You can use a small mirror in front and a large mirror behind you to keep an eye on the back side or your body.   If you see any new or changing lesions before your next follow-up, please call to schedule a visit.  Please continue daily skin protection including broad spectrum sunscreen SPF 30+ to sun-exposed areas, reapplying every 2 hours as needed when you're outdoors.   Staying in the shade or wearing long sleeves, sun glasses (UVA+UVB protection) and wide brim hats (4-inch brim around the entire circumference of the hat) are also recommended for sun protection.     Due to recent changes in healthcare laws, you may see results of your pathology and/or  laboratory studies on MyChart before the doctors have had a chance to review them. We understand that in some cases there may be results that are confusing or concerning to you. Please understand that not all results are received at the same time and often the doctors may need to interpret multiple results in order to provide you with the best plan of care or course of treatment. Therefore, we ask that you please give us  2 business days to thoroughly review all your results before contacting the office for clarification. Should we see a critical lab result, you will be contacted sooner.   If You Need Anything After Your Visit  If you have any questions or concerns for your doctor, please call our main line at [phone] and press option 4 to reach your doctor's medical assistant. If no one answers, please leave a voicemail as directed and we will return your call as soon as possible. Messages left after 4 pm will be answered the following business day.   You may also send us  a message via MyChart. We typically respond to MyChart messages within 1-2 business days.  For prescription refills, please ask your pharmacy to contact our office. Our fax number is [phone].  If you have an urgent issue when the clinic is closed that cannot wait until the next business day, you can page your doctor at the number below.  Please note that while we do our best to be available for urgent issues outside of office hours, we are not available 24/7.   If you have an urgent issue and are unable to reach us , you may choose to seek medical care at your doctor's office, retail clinic, urgent care center, or emergency room.  If you have a medical emergency, please immediately call 911 or go to the emergency department.  Pager Numbers  - Dr. Hester: [phone]  - Dr. Jackquline: [phone]  - Dr. Claudene: [phone]   - Dr. Raymund: [phone]  In the event of inclement weather, please call our main  line at [phone] for an update on the status of any delays or closures.  Dermatology Medication Tips: Please keep the boxes that topical medications come in in order to help keep track of the instructions about where and how to use these. Pharmacies typically print the medication instructions only on the boxes and not directly on the medication tubes.   If your medication is too expensive, please contact our office at [phone] option 4 or send us  a message through MyChart.   We are unable to tell what your co-pay for medications will be in advance as this is different depending on your insurance coverage. However, we may be able to find a substitute medication at lower cost or fill out paperwork to get insurance to cover a needed medication.   If a prior authorization is required to get your medication covered by your insurance company, please allow us  1-2 business days to complete this process.  Drug prices often vary depending on where the prescription is filled and some pharmacies may offer cheaper prices.  The website www.goodrx.com contains coupons for medications through different pharmacies. The prices here do not account for what the cost may be with help from insurance (it may be cheaper with your insurance), but the website can give you the price if you did not use any insurance.  - You can print the associated coupon and take it with your prescription to the pharmacy.  - You may also stop by our office during regular business hours and pick up a GoodRx coupon card.  - If you need your prescription sent electronically to a different pharmacy, notify our office through [facility] or by phone at [phone] option 4.     Si Usted Necesita Algo Despus de Su Visita  Tambin puede enviarnos un mensaje a travs de Clinical Cytogeneticist. Por lo general respondemos a los mensajes de MyChart en el transcurso de 1 a 2 das hbiles.  Para renovar recetas, por favor pida a su farmacia que  se ponga en contacto con nuestra oficina. Randi lakes de fax es [place] [phone].  Si tiene un asunto urgente cuando la clnica est cerrada y que no puede esperar hasta el siguiente da hbil, puede llamar/localizar a su doctor(a) al nmero que aparece a continuacin.   Por favor, tenga en cuenta que aunque hacemos todo lo posible para estar disponibles para asuntos urgentes fuera del horario de [place], no estamos disponibles las 24 horas del da, los 7 [address]  [address] de la [place].   Si tiene un problema urgente y no puede comunicarse con nosotros, puede optar por buscar atencin mdica  en el consultorio de su doctor(a), en una clnica privada, en un centro de atencin urgente o en una sala de emergencias.  Si tiene engineer, drilling, por favor llame inmediatamente al 911 o vaya a la sala de emergencias.  Nmeros  de bper  - Dr. Hester: [phone]  - Dra. Jackquline: [phone]  - Dr. Claudene: [phone]  - Dra. Kitts: [phone]  En caso de inclemencias del [place], por favor llame a nuestra lnea principal al [phone] para una actualizacin sobre el estado de cualquier retraso o cierre.  Consejos para la medicacin en dermatologa: Por favor, guarde las cajas en las que vienen los medicamentos de uso tpico para ayudarle a seguir las instrucciones sobre dnde y cmo usarlos. Las farmacias generalmente imprimen las instrucciones del medicamento slo en las cajas y no directamente en los tubos del [place].   Si su medicamento es muy caro, por favor, pngase en contacto con landry rieger llamando al [phone] y presione la opcin 4 o envenos un mensaje a travs de Clinical Cytogeneticist.   No podemos decirle cul ser su copago por los medicamentos por adelantado ya que esto es diferente dependiendo de la cobertura de su seguro. Sin embargo, es posible que podamos encontrar un medicamento sustituto a audiological scientist un formulario para que el seguro cubra el medicamento que se  considera necesario.   Si se requiere una autorizacin previa para que su compaa de seguros cubra su medicamento, por favor permtanos de 1 a 2 das hbiles para completar este proceso.  Los precios de los medicamentos varan con frecuencia dependiendo del environmental consultant de dnde se surte la receta y alguna farmacias pueden ofrecer precios ms baratos.  El sitio web www.goodrx.com tiene cupones para medicamentos de health and safety inspector. Los precios aqu no tienen en cuenta lo que podra costar con la ayuda del seguro (puede ser ms barato con su seguro), pero el sitio web puede darle el precio si no utiliz tourist information centre manager.  - Puede imprimir el cupn correspondiente y llevarlo con su receta a la farmacia.  - Tambin puede pasar por nuestra oficina durante el horario de atencin regular y education officer, museum una tarjeta de cupones de GoodRx.  - Si necesita que su receta se enve electrnicamente a una farmacia diferente, informe a nuestra oficina a travs de MyChart de [place] o por telfono llamando al [phone] y presione la opcin 4.

## 2024-03-18 ENCOUNTER — Other Ambulatory Visit

## 2024-06-26 ENCOUNTER — Other Ambulatory Visit

## 2024-08-27 ENCOUNTER — Ambulatory Visit: Admitting: Dermatology
